# Patient Record
Sex: Female | Born: 1985 | Hispanic: Yes | Marital: Single | Smoking: Former smoker
Health system: Southern US, Community
[De-identification: ages and names within clinical notes are randomized; demographics above are authoritative.]

---

## 2010-05-09 ENCOUNTER — Inpatient Hospital Stay (HOSPITAL_COMMUNITY): Admission: RE | Admit: 2010-05-09 | Discharge: 2010-05-12 | Payer: Self-pay | Admitting: Obstetrics & Gynecology

## 2010-05-09 ENCOUNTER — Ambulatory Visit: Payer: Self-pay | Admitting: Obstetrics & Gynecology

## 2010-05-19 ENCOUNTER — Ambulatory Visit (HOSPITAL_COMMUNITY): Admission: RE | Admit: 2010-05-19 | Discharge: 2010-05-19 | Payer: Self-pay | Admitting: Obstetrics & Gynecology

## 2010-05-22 ENCOUNTER — Ambulatory Visit: Payer: Self-pay | Admitting: Obstetrics & Gynecology

## 2010-05-29 ENCOUNTER — Ambulatory Visit: Payer: Self-pay | Admitting: Family Medicine

## 2010-06-02 ENCOUNTER — Ambulatory Visit (HOSPITAL_COMMUNITY): Admission: RE | Admit: 2010-06-02 | Discharge: 2010-06-02 | Payer: Self-pay | Admitting: Obstetrics & Gynecology

## 2010-06-05 ENCOUNTER — Ambulatory Visit: Payer: Self-pay | Admitting: Obstetrics & Gynecology

## 2010-06-05 ENCOUNTER — Encounter (INDEPENDENT_AMBULATORY_CARE_PROVIDER_SITE_OTHER): Payer: Self-pay | Admitting: *Deleted

## 2010-06-12 ENCOUNTER — Ambulatory Visit: Payer: Self-pay | Admitting: Obstetrics & Gynecology

## 2010-06-17 ENCOUNTER — Encounter: Payer: Self-pay | Admitting: Obstetrics & Gynecology

## 2010-06-17 ENCOUNTER — Ambulatory Visit: Payer: Self-pay | Admitting: Obstetrics and Gynecology

## 2010-06-17 ENCOUNTER — Inpatient Hospital Stay (HOSPITAL_COMMUNITY): Admission: AD | Admit: 2010-06-17 | Discharge: 2010-06-17 | Payer: Self-pay | Admitting: Obstetrics and Gynecology

## 2010-06-18 ENCOUNTER — Inpatient Hospital Stay (HOSPITAL_COMMUNITY): Admission: AD | Admit: 2010-06-18 | Discharge: 2010-06-18 | Payer: Self-pay | Admitting: Obstetrics and Gynecology

## 2010-06-19 ENCOUNTER — Ambulatory Visit: Payer: Self-pay | Admitting: Obstetrics & Gynecology

## 2010-07-03 ENCOUNTER — Encounter (INDEPENDENT_AMBULATORY_CARE_PROVIDER_SITE_OTHER): Payer: Self-pay | Admitting: *Deleted

## 2010-07-03 ENCOUNTER — Ambulatory Visit: Payer: Self-pay | Admitting: Obstetrics and Gynecology

## 2010-07-03 LAB — CONVERTED CEMR LAB
HCT: 36.5 % (ref 36.0–46.0)
Hemoglobin: 12.5 g/dL (ref 12.0–15.0)
MCHC: 34.2 g/dL (ref 30.0–36.0)
MCV: 98.4 fL (ref 78.0–100.0)
Platelets: 235 10*3/uL (ref 150–400)
RBC: 3.71 M/uL — ABNORMAL LOW (ref 3.87–5.11)
RDW: 13 % (ref 11.5–15.5)
Trich, Wet Prep: NONE SEEN
WBC: 8.3 10*3/uL (ref 4.0–10.5)
Yeast Wet Prep HPF POC: NONE SEEN

## 2010-07-15 ENCOUNTER — Ambulatory Visit (HOSPITAL_COMMUNITY)
Admission: RE | Admit: 2010-07-15 | Discharge: 2010-07-15 | Payer: Self-pay | Source: Home / Self Care | Admitting: Obstetrics & Gynecology

## 2010-07-17 ENCOUNTER — Ambulatory Visit: Payer: Self-pay | Admitting: Family Medicine

## 2010-07-17 ENCOUNTER — Encounter: Payer: Self-pay | Admitting: Physician Assistant

## 2010-07-24 ENCOUNTER — Ambulatory Visit: Payer: Self-pay | Admitting: Obstetrics & Gynecology

## 2010-07-31 ENCOUNTER — Ambulatory Visit: Payer: Self-pay | Admitting: Obstetrics & Gynecology

## 2010-08-07 ENCOUNTER — Ambulatory Visit: Payer: Self-pay | Admitting: Family Medicine

## 2010-08-14 ENCOUNTER — Ambulatory Visit: Payer: Self-pay | Admitting: Obstetrics & Gynecology

## 2010-08-14 ENCOUNTER — Encounter: Payer: Self-pay | Admitting: Obstetrics & Gynecology

## 2010-08-15 ENCOUNTER — Encounter: Payer: Self-pay | Admitting: Obstetrics & Gynecology

## 2010-08-15 LAB — CONVERTED CEMR LAB
Chlamydia, DNA Probe: NEGATIVE
GC Probe Amp, Genital: NEGATIVE

## 2010-08-21 ENCOUNTER — Ambulatory Visit
Admission: RE | Admit: 2010-08-21 | Discharge: 2010-08-21 | Payer: Self-pay | Source: Home / Self Care | Attending: Obstetrics and Gynecology | Admitting: Obstetrics and Gynecology

## 2010-08-21 LAB — POCT URINALYSIS DIPSTICK
Hemoglobin, Urine: NEGATIVE
Ketones, ur: NEGATIVE mg/dL
Nitrite: POSITIVE — AB
Protein, ur: 30 mg/dL — AB
Specific Gravity, Urine: >=1.03 (ref 1.005–1.030)
Urine Glucose, Fasting: NEGATIVE mg/dL
Urobilinogen, UA: 0.2 mg/dL (ref 0.0–1.0)
pH: 6.5 (ref 5.0–8.0)

## 2010-08-22 ENCOUNTER — Encounter (INDEPENDENT_AMBULATORY_CARE_PROVIDER_SITE_OTHER): Payer: Self-pay | Admitting: *Deleted

## 2010-08-23 ENCOUNTER — Inpatient Hospital Stay (HOSPITAL_COMMUNITY)
Admission: AD | Admit: 2010-08-23 | Discharge: 2010-08-25 | Payer: Self-pay | Source: Home / Self Care | Attending: Obstetrics & Gynecology | Admitting: Obstetrics & Gynecology

## 2010-08-28 ENCOUNTER — Ambulatory Visit: Admit: 2010-08-28 | Payer: Self-pay | Admitting: Obstetrics and Gynecology

## 2010-09-01 LAB — CBC
HCT: 37.1 % (ref 36.0–46.0)
Hemoglobin: 13.4 g/dL (ref 12.0–15.0)
MCH: 34.4 pg — ABNORMAL HIGH (ref 26.0–34.0)
MCHC: 36.1 g/dL — ABNORMAL HIGH (ref 30.0–36.0)
MCV: 95.1 fL (ref 78.0–100.0)
Platelets: 163 10*3/uL (ref 150–400)
RBC: 3.9 MIL/uL (ref 3.87–5.11)
RDW: 12.6 % (ref 11.5–15.5)
WBC: 10.1 10*3/uL (ref 4.0–10.5)

## 2010-09-01 LAB — RPR: RPR Ser Ql: NONREACTIVE

## 2010-09-01 LAB — ABO/RH: ABO/RH(D): O POS

## 2010-10-27 LAB — POCT URINALYSIS DIPSTICK
Bilirubin Urine: NEGATIVE
Bilirubin Urine: NEGATIVE
Bilirubin Urine: NEGATIVE
Glucose, UA: NEGATIVE mg/dL
Glucose, UA: NEGATIVE mg/dL
Glucose, UA: NEGATIVE mg/dL
Hgb urine dipstick: NEGATIVE
Hgb urine dipstick: NEGATIVE
Ketones, ur: NEGATIVE mg/dL
Ketones, ur: NEGATIVE mg/dL
Ketones, ur: NEGATIVE mg/dL
Nitrite: NEGATIVE
Nitrite: NEGATIVE
Nitrite: NEGATIVE
Protein, ur: NEGATIVE mg/dL
Protein, ur: NEGATIVE mg/dL
Protein, ur: NEGATIVE mg/dL
Specific Gravity, Urine: 1.02 (ref 1.005–1.030)
Specific Gravity, Urine: 1.02 (ref 1.005–1.030)
Specific Gravity, Urine: 1.025 (ref 1.005–1.030)
Urobilinogen, UA: 0.2 mg/dL (ref 0.0–1.0)
Urobilinogen, UA: 0.2 mg/dL (ref 0.0–1.0)
Urobilinogen, UA: 0.2 mg/dL (ref 0.0–1.0)
pH: 6.5 (ref 5.0–8.0)
pH: 7 (ref 5.0–8.0)
pH: 7 (ref 5.0–8.0)

## 2010-10-28 LAB — POCT URINALYSIS DIPSTICK
Bilirubin Urine: NEGATIVE
Bilirubin Urine: NEGATIVE
Bilirubin Urine: NEGATIVE
Bilirubin Urine: NEGATIVE
Glucose, UA: NEGATIVE mg/dL
Glucose, UA: NEGATIVE mg/dL
Glucose, UA: NEGATIVE mg/dL
Glucose, UA: NEGATIVE mg/dL
Hgb urine dipstick: NEGATIVE
Hgb urine dipstick: NEGATIVE
Hgb urine dipstick: NEGATIVE
Hgb urine dipstick: NEGATIVE
Ketones, ur: NEGATIVE mg/dL
Ketones, ur: NEGATIVE mg/dL
Ketones, ur: NEGATIVE mg/dL
Ketones, ur: NEGATIVE mg/dL
Nitrite: NEGATIVE
Nitrite: NEGATIVE
Nitrite: NEGATIVE
Nitrite: NEGATIVE
Protein, ur: 30 mg/dL — AB
Protein, ur: NEGATIVE mg/dL
Protein, ur: NEGATIVE mg/dL
Protein, ur: NEGATIVE mg/dL
Specific Gravity, Urine: 1.02 (ref 1.005–1.030)
Specific Gravity, Urine: 1.025 (ref 1.005–1.030)
Specific Gravity, Urine: 1.02 (ref 1.005–1.030)
Specific Gravity, Urine: 1.025 (ref 1.005–1.030)
Urobilinogen, UA: 0.2 mg/dL (ref 0.0–1.0)
Urobilinogen, UA: 0.2 mg/dL (ref 0.0–1.0)
Urobilinogen, UA: 0.2 mg/dL (ref 0.0–1.0)
Urobilinogen, UA: 0.2 mg/dL (ref 0.0–1.0)
pH: 6.5 (ref 5.0–8.0)
pH: 6.5 (ref 5.0–8.0)
pH: 6 (ref 5.0–8.0)
pH: 7 (ref 5.0–8.0)

## 2010-10-29 LAB — POCT URINALYSIS DIPSTICK
Bilirubin Urine: NEGATIVE
Bilirubin Urine: NEGATIVE
Glucose, UA: NEGATIVE mg/dL
Glucose, UA: NEGATIVE mg/dL
Hgb urine dipstick: NEGATIVE
Hgb urine dipstick: NEGATIVE
Ketones, ur: NEGATIVE mg/dL
Ketones, ur: NEGATIVE mg/dL
Nitrite: NEGATIVE
Nitrite: NEGATIVE
Protein, ur: NEGATIVE mg/dL
Protein, ur: NEGATIVE mg/dL
Specific Gravity, Urine: 1.015 (ref 1.005–1.030)
Specific Gravity, Urine: 1.02 (ref 1.005–1.030)
Urobilinogen, UA: 0.2 mg/dL (ref 0.0–1.0)
Urobilinogen, UA: 0.2 mg/dL (ref 0.0–1.0)
pH: 7 (ref 5.0–8.0)
pH: 7 (ref 5.0–8.0)

## 2010-10-30 LAB — URINALYSIS, ROUTINE W REFLEX MICROSCOPIC
Bilirubin Urine: NEGATIVE
Glucose, UA: NEGATIVE mg/dL
Hgb urine dipstick: NEGATIVE
Ketones, ur: NEGATIVE mg/dL
Nitrite: NEGATIVE
Protein, ur: NEGATIVE mg/dL
Specific Gravity, Urine: 1.01 (ref 1.005–1.030)
Urobilinogen, UA: 0.2 mg/dL (ref 0.0–1.0)
pH: 7 (ref 5.0–8.0)

## 2010-10-30 LAB — POCT URINALYSIS DIPSTICK
Bilirubin Urine: NEGATIVE
Bilirubin Urine: NEGATIVE
Glucose, UA: NEGATIVE mg/dL
Glucose, UA: NEGATIVE mg/dL
Hgb urine dipstick: NEGATIVE
Hgb urine dipstick: NEGATIVE
Ketones, ur: NEGATIVE mg/dL
Ketones, ur: NEGATIVE mg/dL
Nitrite: NEGATIVE
Nitrite: NEGATIVE
Protein, ur: NEGATIVE mg/dL
Protein, ur: NEGATIVE mg/dL
Specific Gravity, Urine: 1.015 (ref 1.005–1.030)
Specific Gravity, Urine: 1.02 (ref 1.005–1.030)
Urobilinogen, UA: 0.2 mg/dL (ref 0.0–1.0)
Urobilinogen, UA: 0.2 mg/dL (ref 0.0–1.0)
pH: 7 (ref 5.0–8.0)
pH: 7 (ref 5.0–8.0)

## 2010-10-30 LAB — GC/CHLAMYDIA PROBE AMP, GENITAL
Chlamydia, DNA Probe: POSITIVE — AB
GC Probe Amp, Genital: NEGATIVE

## 2010-10-30 LAB — HIV ANTIBODY (ROUTINE TESTING W REFLEX): HIV: NONREACTIVE

## 2010-10-30 LAB — CBC
MCV: 103.2 fL — ABNORMAL HIGH (ref 78.0–100.0)
Platelets: 206 10*3/uL (ref 150–400)
RDW: 12.7 % (ref 11.5–15.5)
WBC: 8.3 10*3/uL (ref 4.0–10.5)

## 2010-10-30 LAB — WET PREP, GENITAL
Clue Cells Wet Prep HPF POC: NONE SEEN
Trich, Wet Prep: NONE SEEN
Yeast Wet Prep HPF POC: NONE SEEN

## 2010-10-30 LAB — RPR: RPR Ser Ql: NONREACTIVE

## 2010-10-30 LAB — URINE MICROSCOPIC-ADD ON

## 2010-10-30 LAB — STREP B DNA PROBE: Strep Group B Ag: NEGATIVE

## 2010-12-09 IMAGING — US US OB TRANSVAGINAL
1 series · 14 of 24 positions shown · non-contrast
Comparison: none

OBSTETRICAL ULTRASOUND:
 This ultrasound was performed in The [HOSPITAL], and the AS OB/GYN report will be stored to [REDACTED] PACS.  This report is also available in [HOSPITAL]?s accessANYware.

[Series 1: us ob transvaginal · 14 of 24 slices shown]
[im 1/24]
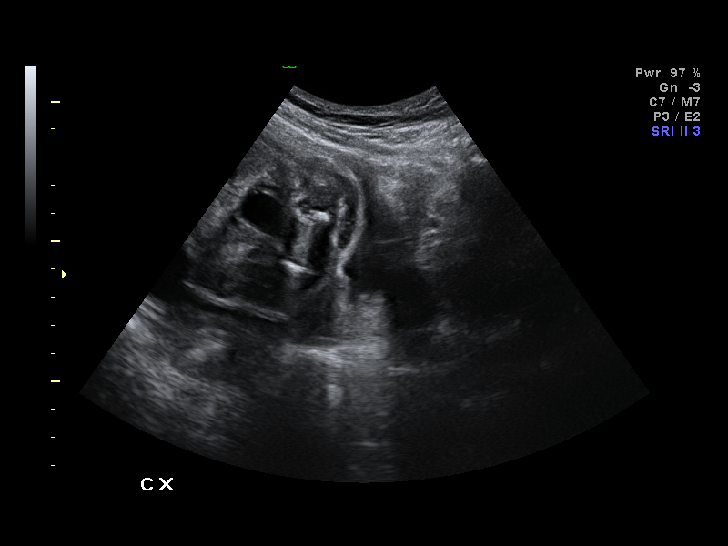
[im 3/24]
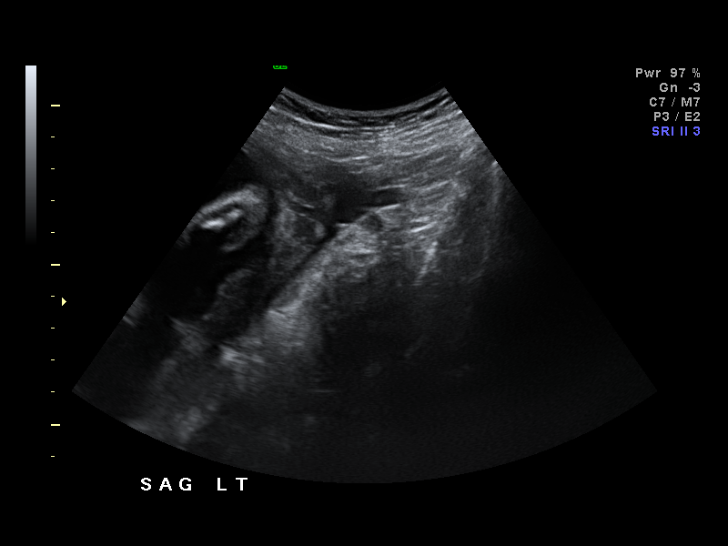
[im 5/24]
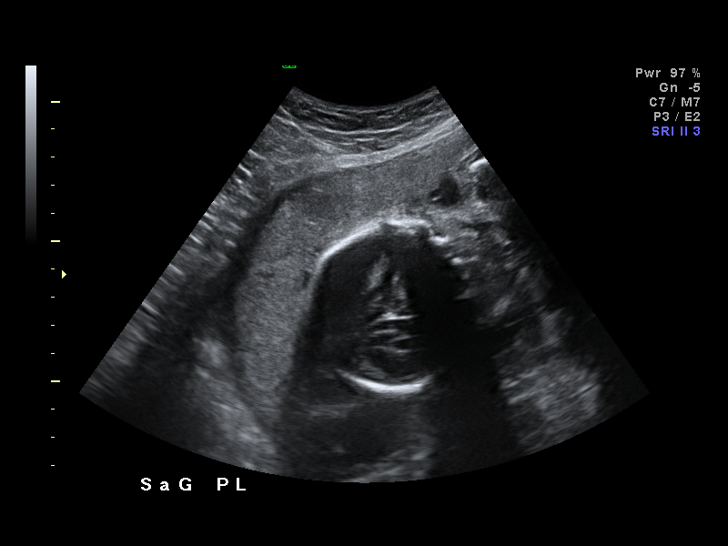
[im 7/24]
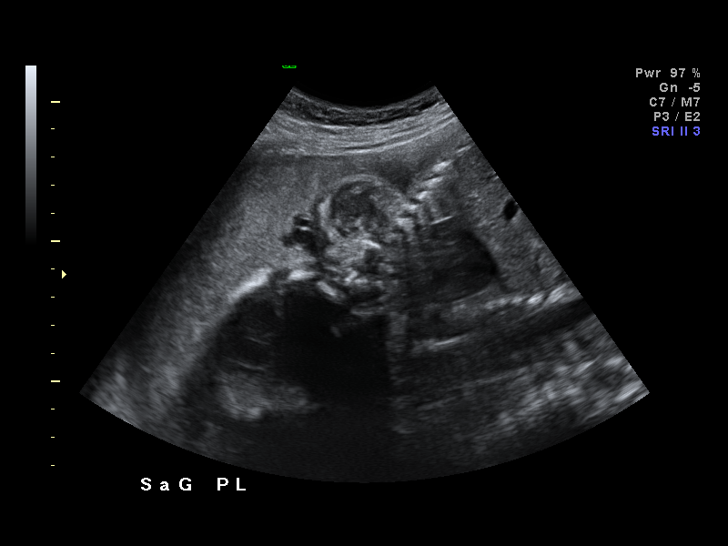
[im 8/24]
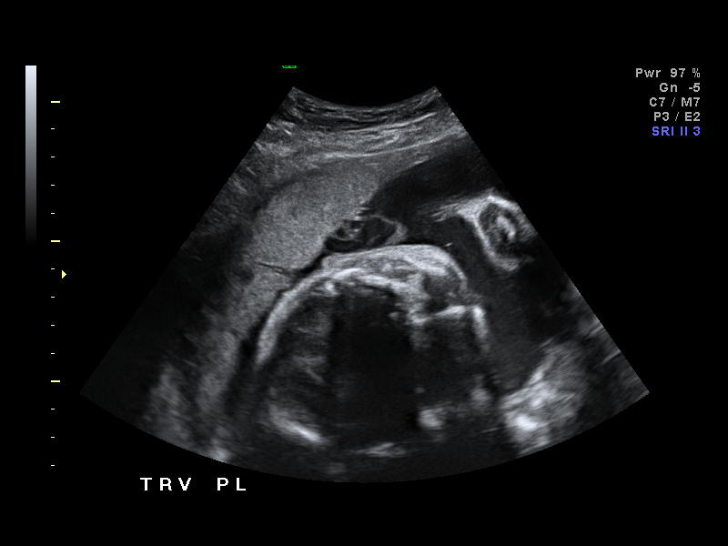
[im 10/24]
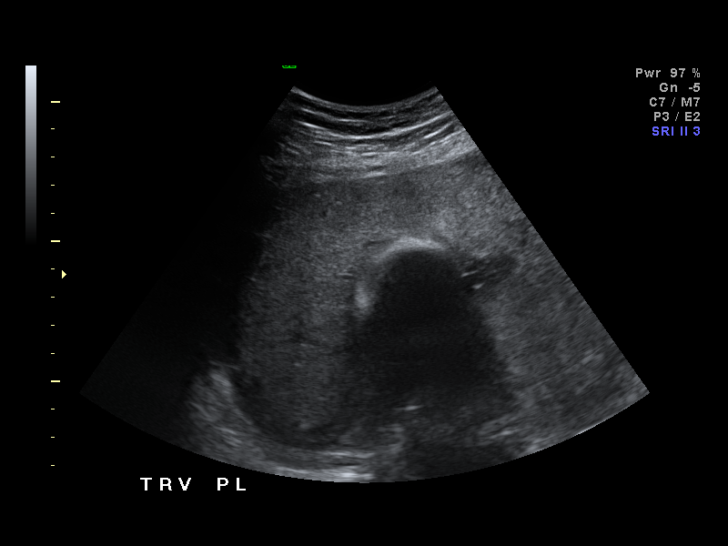
[im 12/24]
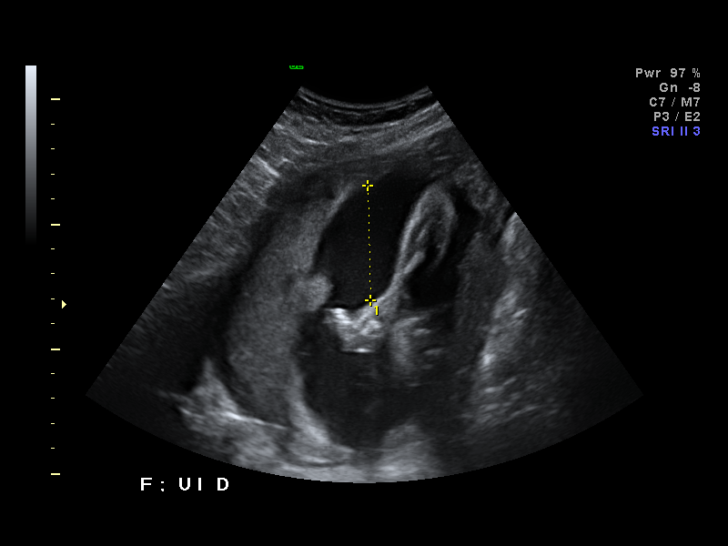
[im 13/24]
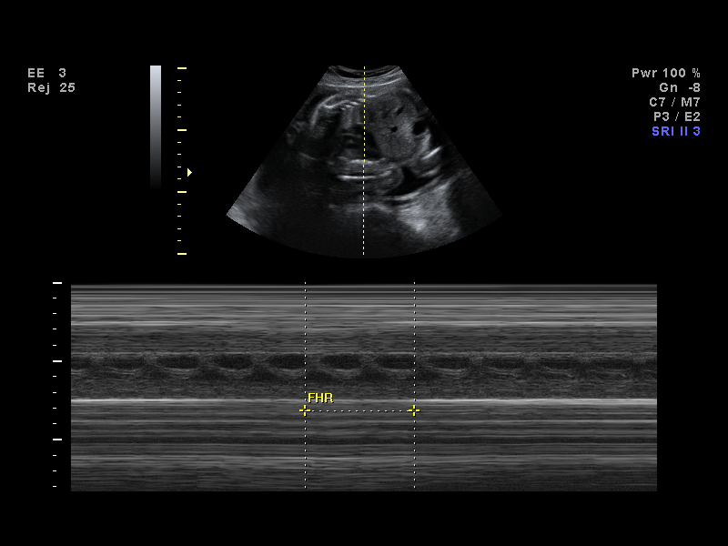
[im 15/24]
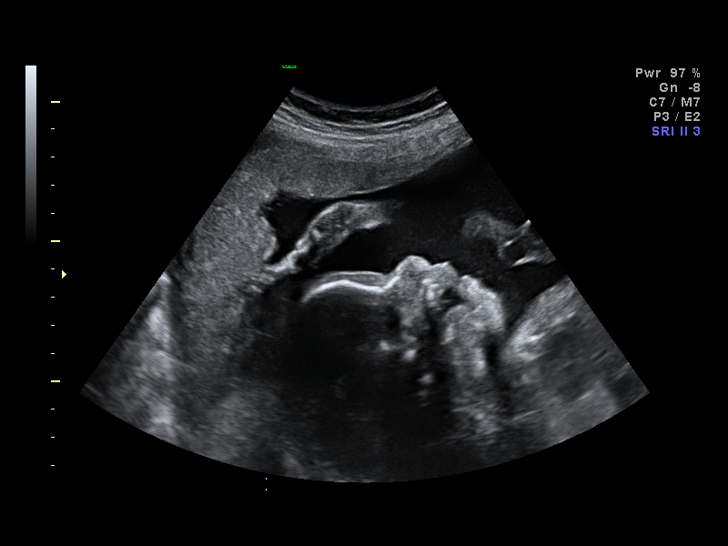
[im 17/24]
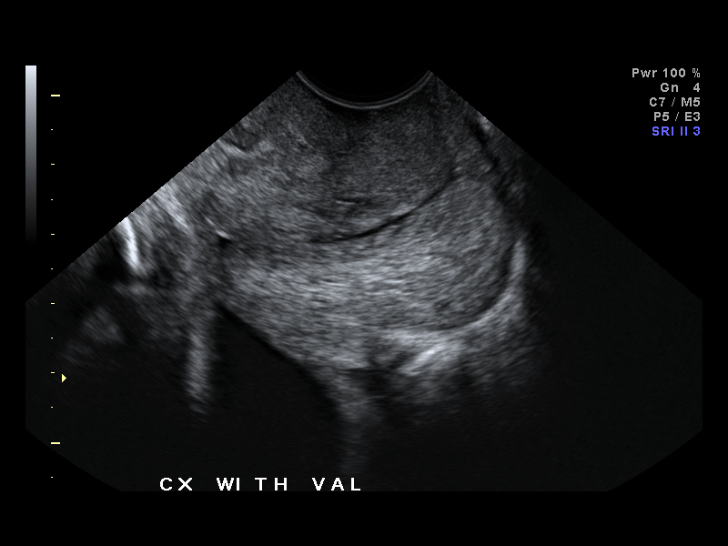
[im 19/24]
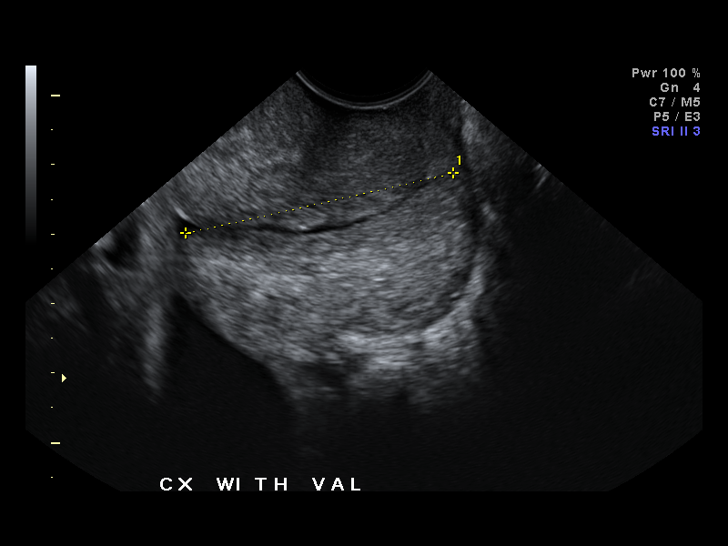
[im 20/24]
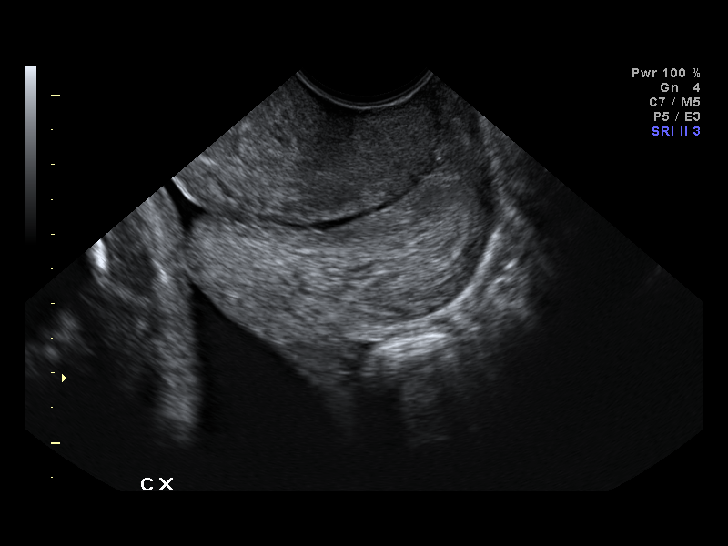
[im 22/24]
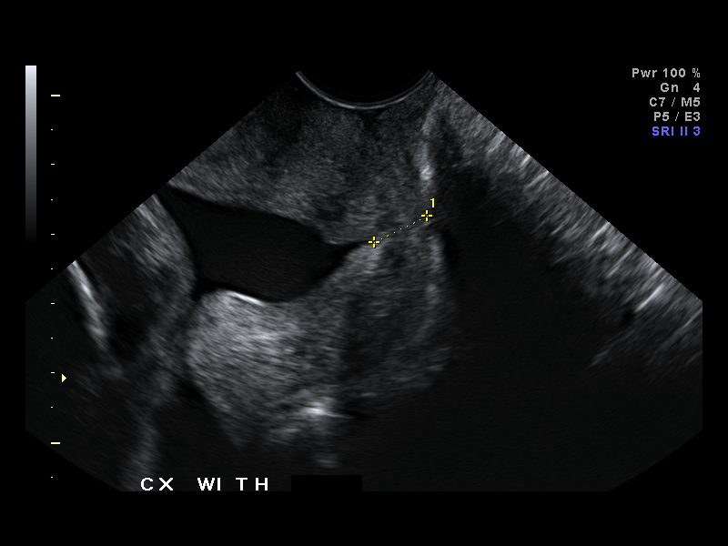
[im 24/24]
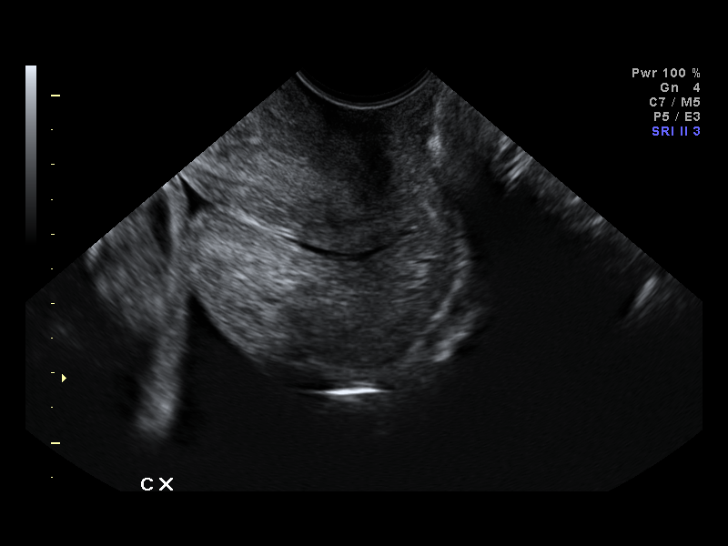

[14 of 24 positions shown; findings below may reference images not displayed]

IMPRESSION: AS OB/GYN has also been faxed to the ordering physician.

## 2010-12-24 IMAGING — US US OB TRANSVAGINAL
1 series · 18 of 28 positions shown · non-contrast
Comparison: none

OBSTETRICAL ULTRASOUND:
 This ultrasound was performed in The [HOSPITAL], and the AS OB/GYN report will be stored to [REDACTED] PACS.  This report is also available in [HOSPITAL]?s accessANYware.

[Series 1: us ob transvaginal · 36 acquisitions, 18 frames shown]
[im 1/36]
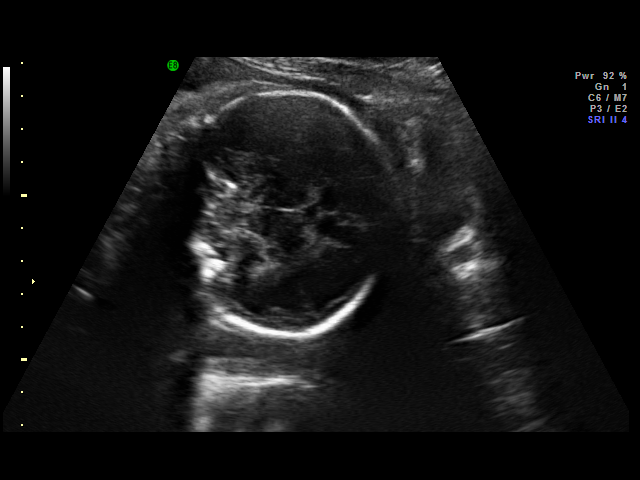
[im 3/36]
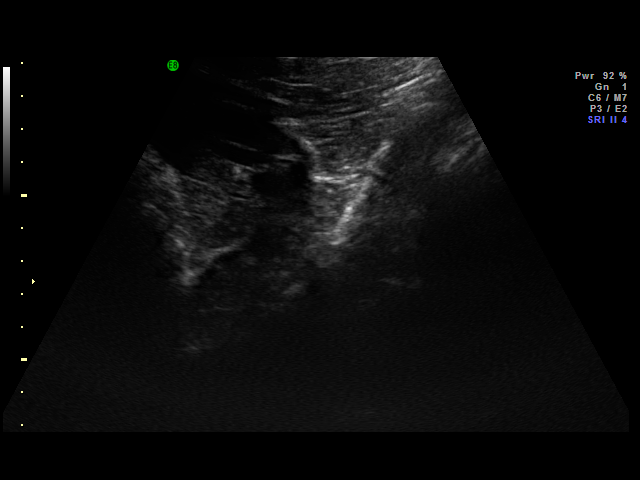
[im 4/36]
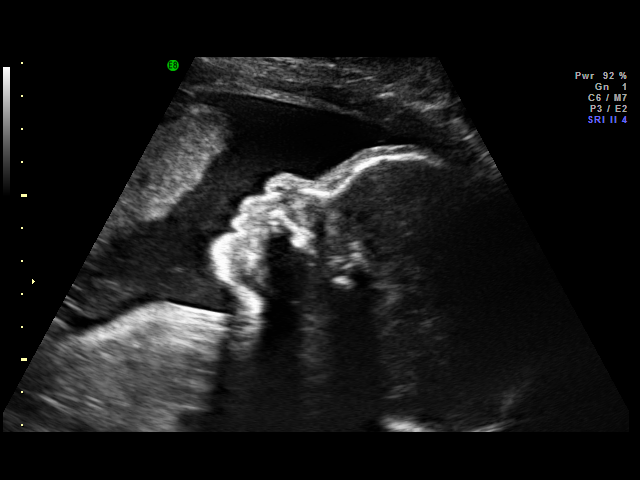
[im 7/36]
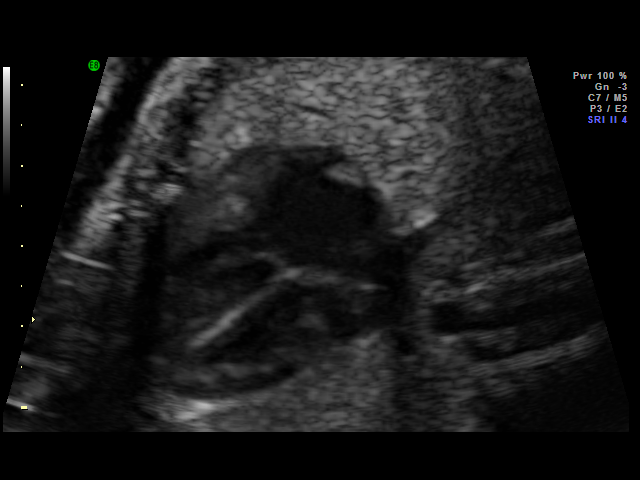
[im 10/36]
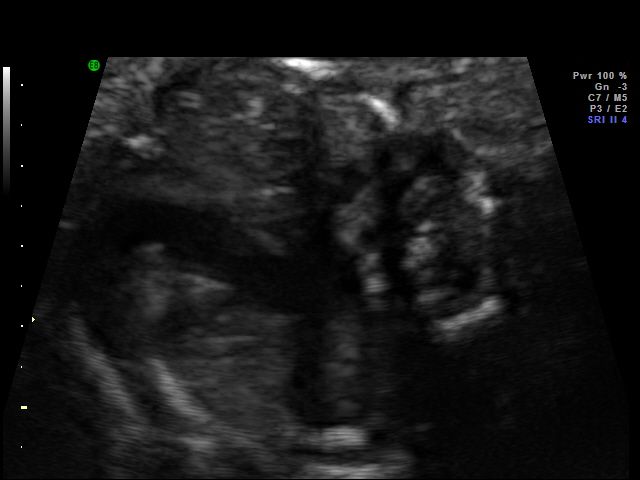
[im 11/36]
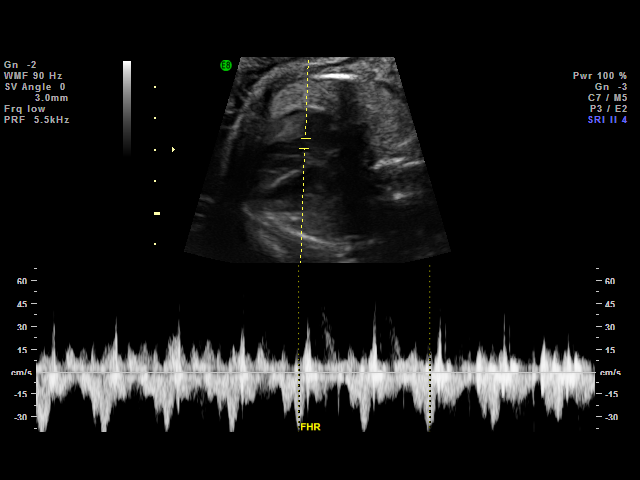
[im 13/36]
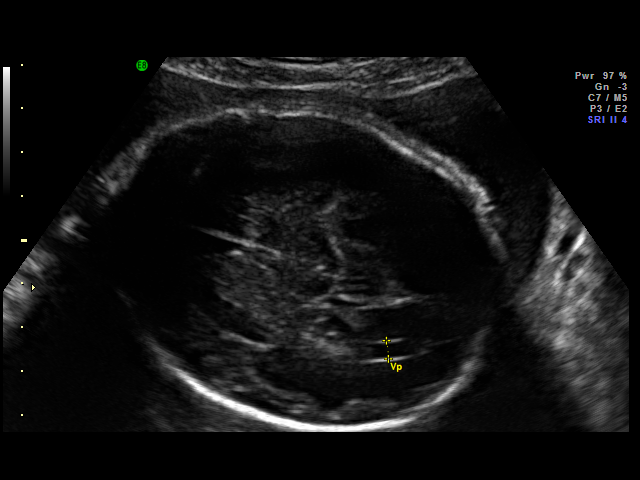
[im 15/36]
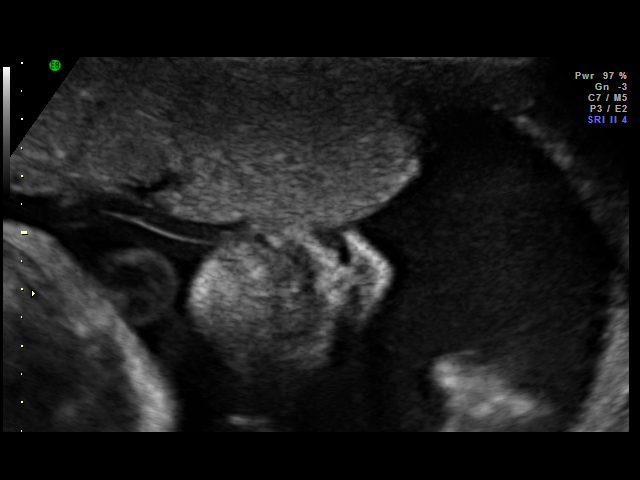
[im 17/36]
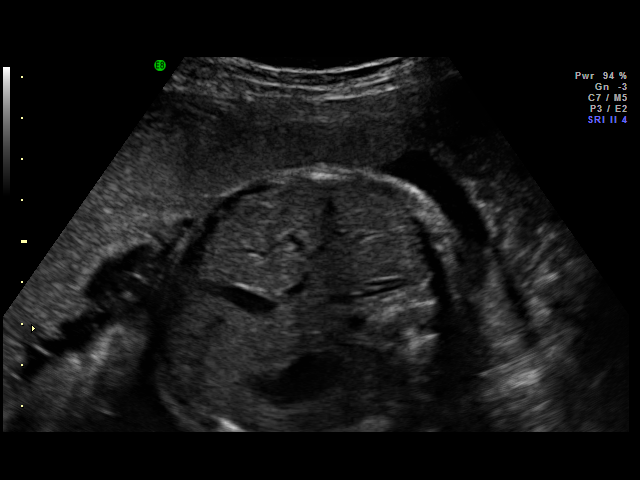
[im 19/36]
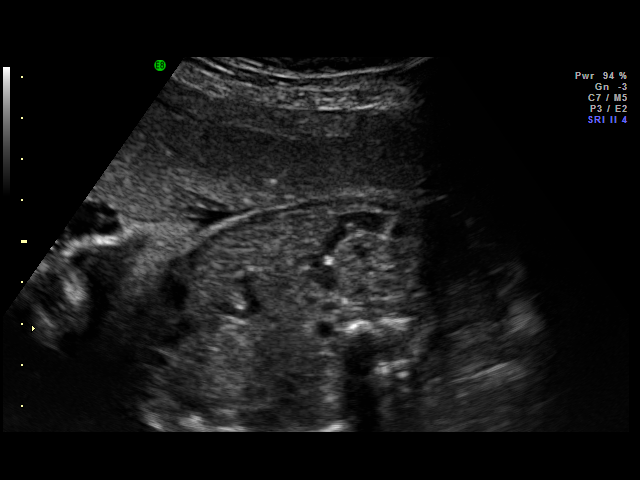
[im 21/36]
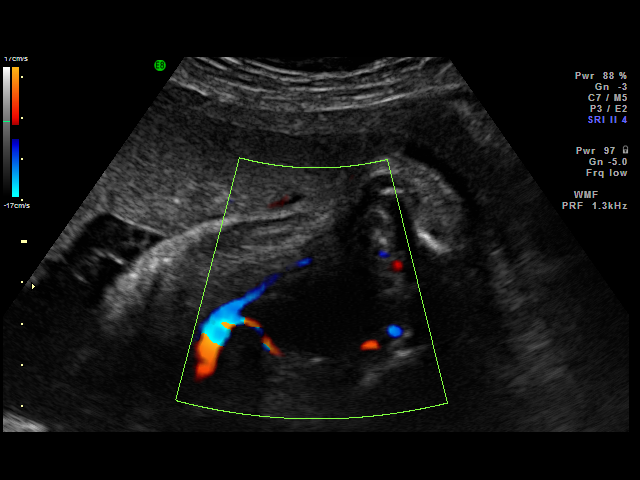
[im 23/36]
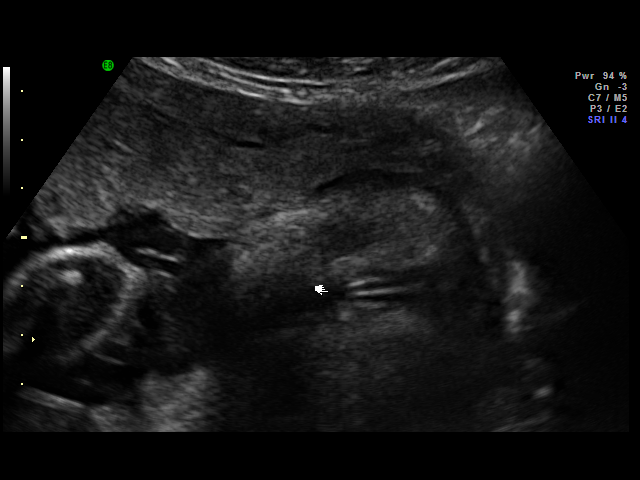
[im 25/36]
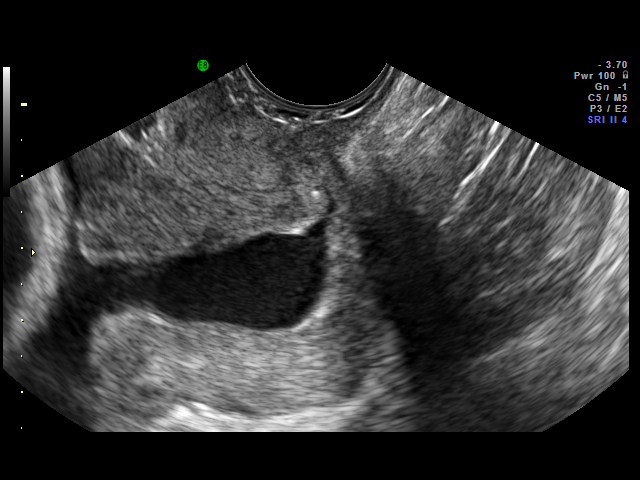
[im 28/36]
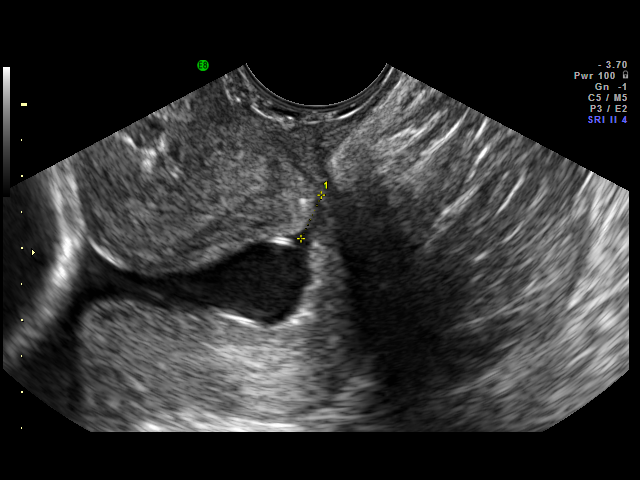
[im 29/36]
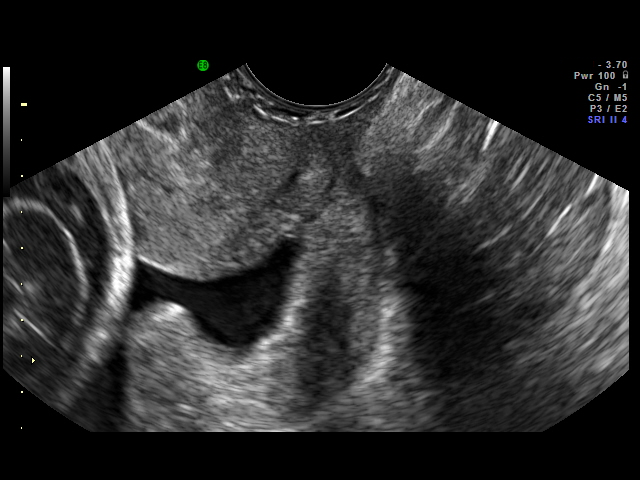
[im 32/36]
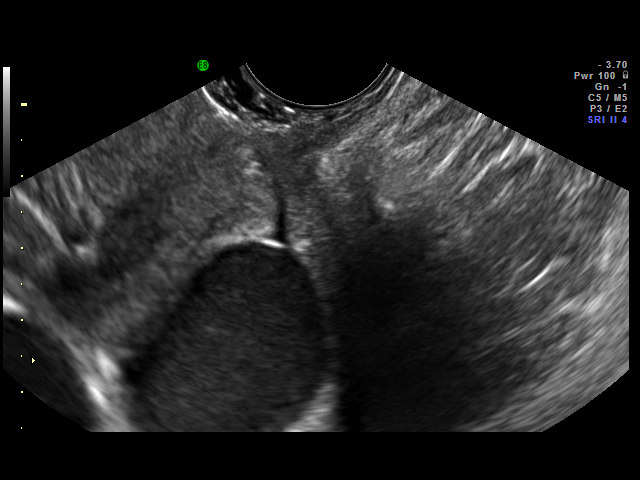
[im 33/36]
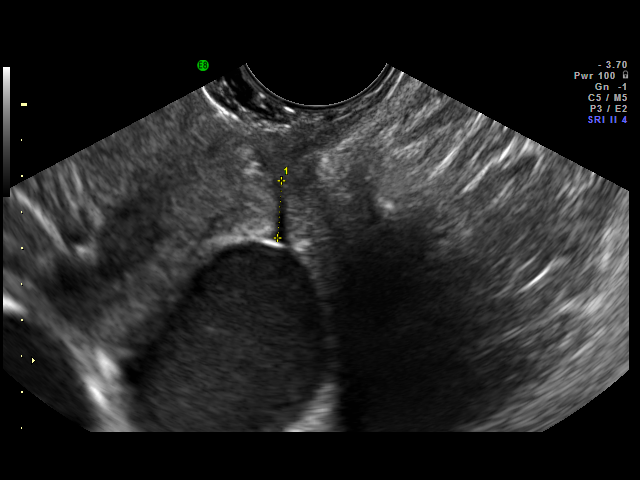
[im 36/36]
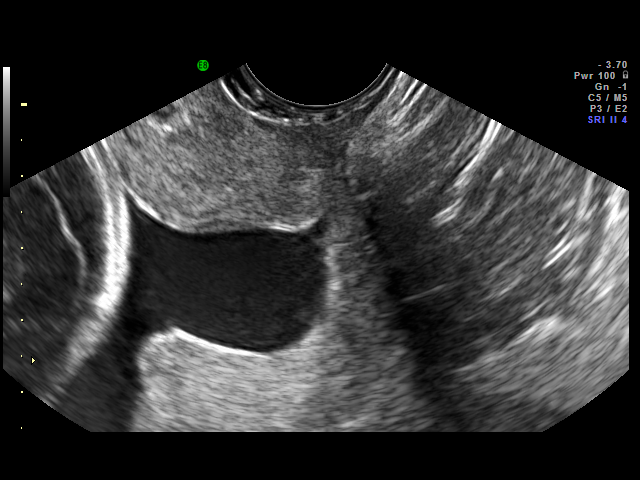

[18 of 28 positions shown; findings below may reference images not displayed]

IMPRESSION: AS OB/GYN has also been faxed to the ordering physician.

## 2013-08-17 NOTE — L&D Delivery Note (Signed)
Delivery Note At 7:26 AM a viable female was delivered via Vaginal, Spontaneous Delivery (Presentation: ; Occiput Anterior).  APGAR: 8, 9; weight pending .   Placenta status: Intact, Spontaneous.  Cord: 3 vessels with the following complications: 1 loose nuchal, reduced prior to delivery.  Cord pH: pending  Anesthesia: None  Episiotomy: None Lacerations: 1st degree Suture Repair: N/A Est. Blood Loss (mL): 250  Mom to postpartum.  Baby to Couplet care / Skin to Skin.  Joanna Puff 05/11/2014, 8:01 AM  I was gloved and present for delivery in its entirety.  Second stage of labor progressed, baby delivered after few contractions.  no decels during second stage noted.  Complications: none  Lacerations: small hemostatic 1st degree  EBL: 250  Bayyinah Dukeman ROCIO, MD 5:42 AM

## 2014-01-11 ENCOUNTER — Other Ambulatory Visit (HOSPITAL_COMMUNITY): Payer: Self-pay | Admitting: Family

## 2014-01-11 DIAGNOSIS — Z3689 Encounter for other specified antenatal screening: Secondary | ICD-10-CM

## 2014-01-11 LAB — OB RESULTS CONSOLE HIV ANTIBODY (ROUTINE TESTING): HIV: NONREACTIVE

## 2014-01-11 LAB — OB RESULTS CONSOLE RPR: RPR: NONREACTIVE

## 2014-01-11 LAB — OB RESULTS CONSOLE ANTIBODY SCREEN: Antibody Screen: NEGATIVE

## 2014-01-11 LAB — OB RESULTS CONSOLE ABO/RH: RH TYPE: POSITIVE

## 2014-01-16 ENCOUNTER — Ambulatory Visit (HOSPITAL_COMMUNITY)
Admission: RE | Admit: 2014-01-16 | Discharge: 2014-01-16 | Disposition: A | Discharge status: Home / Self Care | Payer: Medicaid Other | Source: Ambulatory Visit | Attending: Family | Admitting: Family

## 2014-01-16 DIAGNOSIS — Z3689 Encounter for other specified antenatal screening: Secondary | ICD-10-CM | POA: Insufficient documentation

## 2014-04-19 LAB — OB RESULTS CONSOLE GC/CHLAMYDIA
Chlamydia: NEGATIVE
GC PROBE AMP, GENITAL: NEGATIVE

## 2014-04-19 LAB — OB RESULTS CONSOLE HIV ANTIBODY (ROUTINE TESTING): HIV: NONREACTIVE

## 2014-04-19 LAB — OB RESULTS CONSOLE GBS: STREP GROUP B AG: NEGATIVE

## 2014-05-11 ENCOUNTER — Inpatient Hospital Stay (HOSPITAL_COMMUNITY)
Admission: AD | Admit: 2014-05-11 | Discharge: 2014-05-12 | DRG: 775 | Disposition: A | Discharge status: Home / Self Care | Payer: Medicaid Other | Source: Ambulatory Visit | Attending: Family Medicine | Admitting: Family Medicine

## 2014-05-11 ENCOUNTER — Encounter (HOSPITAL_COMMUNITY): Payer: Self-pay | Admitting: Medical

## 2014-05-11 DIAGNOSIS — Z87891 Personal history of nicotine dependence: Secondary | ICD-10-CM

## 2014-05-11 DIAGNOSIS — Z3490 Encounter for supervision of normal pregnancy, unspecified, unspecified trimester: Secondary | ICD-10-CM

## 2014-05-11 DIAGNOSIS — O479 False labor, unspecified: Secondary | ICD-10-CM | POA: Diagnosis present

## 2014-05-11 LAB — CBC
HCT: 35.6 % — ABNORMAL LOW (ref 36.0–46.0)
HEMOGLOBIN: 12.9 g/dL (ref 12.0–15.0)
MCH: 34.8 pg — ABNORMAL HIGH (ref 26.0–34.0)
MCHC: 36.2 g/dL — ABNORMAL HIGH (ref 30.0–36.0)
MCV: 96 fL (ref 78.0–100.0)
PLATELETS: 170 10*3/uL (ref 150–400)
RBC: 3.71 MIL/uL — AB (ref 3.87–5.11)
RDW: 12.5 % (ref 11.5–15.5)
WBC: 9.9 10*3/uL (ref 4.0–10.5)

## 2014-05-11 LAB — TYPE AND SCREEN
ABO/RH(D): O POS
Antibody Screen: NEGATIVE

## 2014-05-11 LAB — RPR

## 2014-05-11 MED ORDER — ONDANSETRON HCL 4 MG/2ML IJ SOLN: 4.0000 mg | Freq: Four times a day (QID) | INTRAMUSCULAR | Status: DC | PRN

## 2014-05-11 MED ORDER — ZOLPIDEM TARTRATE 5 MG PO TABS: 5.0000 mg | ORAL_TABLET | Freq: Every evening | ORAL | Status: DC | PRN

## 2014-05-11 MED ORDER — SENNOSIDES-DOCUSATE SODIUM 8.6-50 MG PO TABS
2.0000 | ORAL_TABLET | ORAL | Status: DC
  Administered 2014-05-12: 2 via ORAL
  Filled 2014-05-11: qty 2

## 2014-05-11 MED ORDER — FLEET ENEMA 7-19 GM/118ML RE ENEM
1.0000 | ENEMA | RECTAL | Status: DC | PRN
Start: 2014-05-11 — End: 2014-05-11

## 2014-05-11 MED ORDER — OXYTOCIN BOLUS FROM INFUSION
500.0000 mL | INTRAVENOUS | Status: DC
  Administered 2014-05-11: 500 mL via INTRAVENOUS

## 2014-05-11 MED ORDER — TETANUS-DIPHTH-ACELL PERTUSSIS 5-2.5-18.5 LF-MCG/0.5 IM SUSP: 0.5000 mL | Freq: Once | INTRAMUSCULAR | Status: DC

## 2014-05-11 MED ORDER — DIBUCAINE 1 % RE OINT: 1.0000 "application " | TOPICAL_OINTMENT | RECTAL | Status: DC | PRN

## 2014-05-11 MED ORDER — LACTATED RINGERS IV SOLN: INTRAVENOUS | Status: DC

## 2014-05-11 MED ORDER — LANOLIN HYDROUS EX OINT: TOPICAL_OINTMENT | CUTANEOUS | Status: DC | PRN

## 2014-05-11 MED ORDER — WITCH HAZEL-GLYCERIN EX PADS: 1.0000 "application " | MEDICATED_PAD | CUTANEOUS | Status: DC | PRN

## 2014-05-11 MED ORDER — IBUPROFEN 600 MG PO TABS
600.0000 mg | ORAL_TABLET | Freq: Four times a day (QID) | ORAL | Status: DC
  Administered 2014-05-11 – 2014-05-12 (×5): 600 mg via ORAL
  Filled 2014-05-11 (×5): qty 1

## 2014-05-11 MED ORDER — ONDANSETRON HCL 4 MG/2ML IJ SOLN: 4.0000 mg | INTRAMUSCULAR | Status: DC | PRN

## 2014-05-11 MED ORDER — OXYCODONE-ACETAMINOPHEN 5-325 MG PO TABS: 1.0000 | ORAL_TABLET | ORAL | Status: DC | PRN

## 2014-05-11 MED ORDER — ACETAMINOPHEN 325 MG PO TABS: 650.0000 mg | ORAL_TABLET | ORAL | Status: DC | PRN

## 2014-05-11 MED ORDER — OXYTOCIN 40 UNITS IN LACTATED RINGERS INFUSION - SIMPLE MED
62.5000 mL/h | INTRAVENOUS | Status: DC
  Filled 2014-05-11: qty 1000

## 2014-05-11 MED ORDER — ONDANSETRON HCL 4 MG PO TABS: 4.0000 mg | ORAL_TABLET | ORAL | Status: DC | PRN

## 2014-05-11 MED ORDER — LACTATED RINGERS IV SOLN: 500.0000 mL | INTRAVENOUS | Status: DC | PRN

## 2014-05-11 MED ORDER — CITRIC ACID-SODIUM CITRATE 334-500 MG/5ML PO SOLN
30.0000 mL | ORAL | Status: DC | PRN
Start: 2014-05-11 — End: 2014-05-11

## 2014-05-11 MED ORDER — PRENATAL MULTIVITAMIN CH
1.0000 | ORAL_TABLET | Freq: Every day | ORAL | Status: DC
  Administered 2014-05-11 – 2014-05-12 (×2): 1 via ORAL
  Filled 2014-05-11 (×2): qty 1

## 2014-05-11 MED ORDER — OXYCODONE-ACETAMINOPHEN 5-325 MG PO TABS: 2.0000 | ORAL_TABLET | ORAL | Status: DC | PRN

## 2014-05-11 MED ORDER — LIDOCAINE HCL (PF) 1 % IJ SOLN
30.0000 mL | INTRAMUSCULAR | Status: DC | PRN
  Filled 2014-05-11: qty 30

## 2014-05-11 MED ORDER — DIPHENHYDRAMINE HCL 25 MG PO CAPS: 25.0000 mg | ORAL_CAPSULE | Freq: Four times a day (QID) | ORAL | Status: DC | PRN

## 2014-05-11 MED ORDER — BENZOCAINE-MENTHOL 20-0.5 % EX AERO
1.0000 "application " | INHALATION_SPRAY | CUTANEOUS | Status: DC | PRN
  Administered 2014-05-11: 1 via TOPICAL
  Filled 2014-05-11: qty 56

## 2014-05-11 MED ORDER — SIMETHICONE 80 MG PO CHEW: 80.0000 mg | CHEWABLE_TABLET | ORAL | Status: DC | PRN

## 2014-05-11 NOTE — MAU Note (Signed)
PT  SAYS SHE HURT BAD AT  0200.   WENT TO HD  TODAY-  VE -  3-4 CM  . DENIES HSV AND MRSA.  GBS-  NEG

## 2014-05-11 NOTE — H&P (Signed)
Brooke [name] is a 28 y.o. female G2P1001 with IUP at [redacted]w[redacted]d presenting for contractions. No LOF or vaginal bleeding. Good fetal movement  [facility]/16/14 by LMP c/w 23.1wk U/S   Prenatal History/Complications: Late to prenatal care  Past Medical History: None  Past Surgical History: History reviewed. No pertinent past surgical history.  Obstetrical History: OB History   Grav Para Term Preterm Abortions TAB SAB Ect Mult Living   Social History: History   Social History  . Marital Status: Single    Spouse Name: N/A    Number of Children: N/A  . Years of Education: N/A   Social History Main Topics  . Smoking status: Former Games developer  . Smokeless tobacco: None  . Alcohol Use: No  . Drug Use: No  . Sexual Activity: None   Other Topics Concern  . None   Social History Narrative  . None    Family History: History reviewed. No pertinent family history.  Allergies: No Known Allergies  Prescriptions prior to admission  Medication Sig Dispense Refill  . beta carotene w/minerals (OCUVITE) tablet Take 1 tablet by mouth daily.         Review of Systems   Constitutional: No fever, chills, fatigue, headache  Blood pressure 117/67, pulse 68, temperature 98.4 F (36.9 C), temperature source Oral, resp. rate 20, height 5' (1.524 m), weight 66.735 kg (147 lb 2 oz), last menstrual period 08/09/2013. General appearance: alert, cooperative and no distress Lungs: clear to auscultation bilaterally Heart: regular rate and rhythm Abdomen: soft, non-tender; bowel sounds normal Pelvic: adequate Extremities: Homans sign is negative, no sign of DVT DTR's 2+ Presentation: cephalic Fetal monitoringBaseline: 125 bpm, Variability: Good {> 6 bpm), Accelerations: Reactive and Decelerations: Absent Uterine activityFrequency: Every 2 minutes Dilation: 6 Effacement (%): 90 Station: -1 Exam by:: DCALLAWAY, RN   Prenatal labs: ABO,  Rh:  O pos Antibody:  Neg Rubella:  immune RPR:   neg HBsAg:   neg HIV:   neg GBS:   neg 1 hr Glucola 95>>86 Genetic screening Declined Anatomy US normal  22.6wk U/S: EFW 1lb 5oz, 61%   Prenatal Transfer Tool  Maternal Diabetes: No Genetic Screening: Normal Maternal Ultrasounds/Referrals: Normal Fetal Ultrasounds or other Referrals:  None Maternal Substance Abuse:  No Significant Maternal Medications:  None Significant Maternal Lab Results: Lab values include: Group B Strep negative  Assessment: Brooke [name] is a 28 y.o. G2P1001 at [redacted]w[redacted]d by LMP c/w 23.1wk U/S here for SOOL. #Labor: Will reassess in 2 hours and determine whether augmentation with pitocin is required #Pain: No medications  #FWB: Cat 1 #ID:  GBS neg  #MOF: Breast/bottle #MOC:Nexplanon  #Circ:  Girl  Joanna Puff 05/11/2014, 3:30 AM   OB fellow attestation:  I have seen and examined this patient; I agree with above documentation in the resident's note.   Brooke [name] is a 28 y.o. G2P2002 reporting SOL +FM, denies LOF, VB, vaginal discharge.  PE: BP 103/53  Pulse 72  Temp(Src) 98.5 F (36.9 C) (Oral)  Resp 18  Ht 5' (1.524 m)  Wt 147 lb (66.679 kg)  BMI 28.71 kg/m2  LMP 08/09/2013  Breastfeeding? Unknown Gen: calm comfortable, NAD Resp: normal effort, no distress Abd: gravid  ROS, labs, PMH reviewed NST reactive  Plan: - admit to L&D, anticipate SVD  Perry Mount, MD 5:45 AM

## 2014-05-11 NOTE — Lactation Note (Signed)
This note was copied from the chart of Brooke Meshelle Rojas-Salazar. Lactation Consultation Note Initial visit at 9 hours of age.  Baby laying STS on mom's chest.  Mom reports baby just finished a feeding on both breasts.  [facility] LC resources given and discussed.  Encouraged to feed with early cues on demand.  Early newborn behavior discussed.  Hand expression demonstrated no colostrum visible at this time.  Mom unsure about how long she plans to continue breastfeeding, but mentions WIC told her breast milk was best.  I encouraged mom to consider offering any amount of breatmilk if she switches explaining it doesn't have to be all or nothing.    Mom to call for assist as needed.    Patient Name: Brooke [name] [id number] Date: 05/11/2014 Reason for consult: Initial assessment   Maternal Data Has patient been taught Hand Expression?: Yes Does the patient have breastfeeding experience prior to this delivery?: Yes  Feeding    LATCH Score/Interventions                Intervention(s): Breastfeeding basics reviewed     Lactation Tools Discussed/Used WIC Program: Yes   Consult Status Consult Status: Follow-up Date: 05/12/14 Follow-up type: In-patient    Jannifer Rodney 05/11/2014, 4:43 PM

## 2014-05-12 LAB — CBC
HEMATOCRIT: 29.3 % — AB (ref 36.0–46.0)
Hemoglobin: 10.3 g/dL — ABNORMAL LOW (ref 12.0–15.0)
MCH: 34.8 pg — AB (ref 26.0–34.0)
MCHC: 35.8 g/dL (ref 30.0–36.0)
MCV: 97 fL (ref 78.0–100.0)
Platelets: 139 10*3/uL — ABNORMAL LOW (ref 150–400)
RBC: 3.02 MIL/uL — ABNORMAL LOW (ref 3.87–5.11)
RDW: 12.7 % (ref 11.5–15.5)
WBC: 10.6 10*3/uL — ABNORMAL HIGH (ref 4.0–10.5)

## 2014-05-12 NOTE — Discharge Summary (Signed)
Obstetric Discharge Summary Reason for Admission: onset of labor Prenatal Procedures: none Intrapartum Procedures: spontaneous vaginal delivery Postpartum Procedures: none Complications-Operative and Postpartum: 1st degree perineal laceration  Delivery Note At 7:26 AM a viable female was delivered via Vaginal, Spontaneous Delivery (Presentation: ; Occiput Anterior).  APGAR: 8, 9; weight 7 lb 7 oz (3374 g).   Placenta status: Intact, Spontaneous.  Cord: 3 vessels with the following complications: None.  Anesthesia: None  Episiotomy: None Lacerations: 1st degree Suture Repair: n/a, hemostatic Est. Blood Loss (mL): 250  Mom to postpartum.  Baby to Couplet care / Skin to Skin.  Brooke [name] 05/12/2014, 6:26 AM     Hospital Course:  Active Problems:   Pregnancy   Today: No acute events overnight.  Pt denies problems with ambulating, voiding or po intake.  She denies nausea or vomiting.  Pain is well controlled.  Lochia Moderate.  Plan for birth control isnexplanon.  Method of Feeding: breast  Brooke [name] is a 28 y.o. [id number] s/p SVD.  Patient presented to OBT with SOL and was admitted to L&D.  She has postpartum course that was uncomplicated including no problems with ambulating, PO intake, urination, pain, or bleeding. The pt feels ready to go home and  will be discharged with outpatient follow-up.    H/H: Lab Results  Component Value Date/Time   HGB 12.9 05/11/2014  4:00 AM   HCT 35.6* 05/11/2014  4:00 AM    Discharge Diagnoses: Term Pregnancy-delivered  Discharge Information: Date: 05/12/2014 Activity: pelvic rest Diet: routine  Medications: None Breast feeding:  Yes Condition: stable Instructions: refer to handout Discharge to: home      Medication List    ASK your doctor about these medications       prenatal multivitamin Tabs tablet  Take 1 tablet by mouth daily at 12 noon.         Perry Mount ,MD OB Fellow 05/12/2014,6:26  AM

## 2014-05-12 NOTE — Discharge Instructions (Signed)

## 2014-05-13 NOTE — Discharge Summary (Signed)
Attestation of Attending Supervision of Advanced Practitioner: Evaluation and management procedures were performed by the PA/NP/CNM/OB Fellow under my supervision/collaboration. Chart reviewed and agree with management and plan.  Brooke [name] V 05/13/2014 4:47 PM

## 2014-05-16 NOTE — Progress Notes (Signed)
Post discharge chart review completed.  

## 2014-06-18 ENCOUNTER — Encounter (HOSPITAL_COMMUNITY): Payer: Self-pay | Admitting: Medical

## 2014-07-25 IMAGING — US US OB COMP +14 WK
1 series · 12 of 28 positions shown · non-contrast
Comparison: none

[Series 1: us ob comp +14 wk · 12 of 97 slices shown]
[im 4/97]
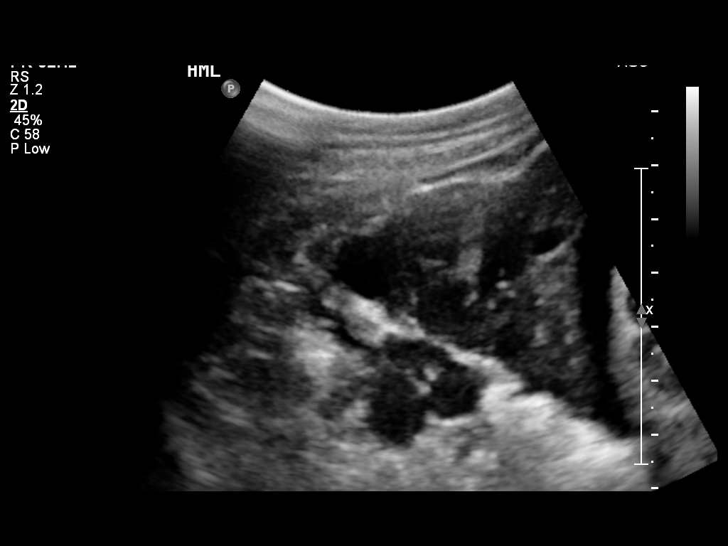
[im 11/97]
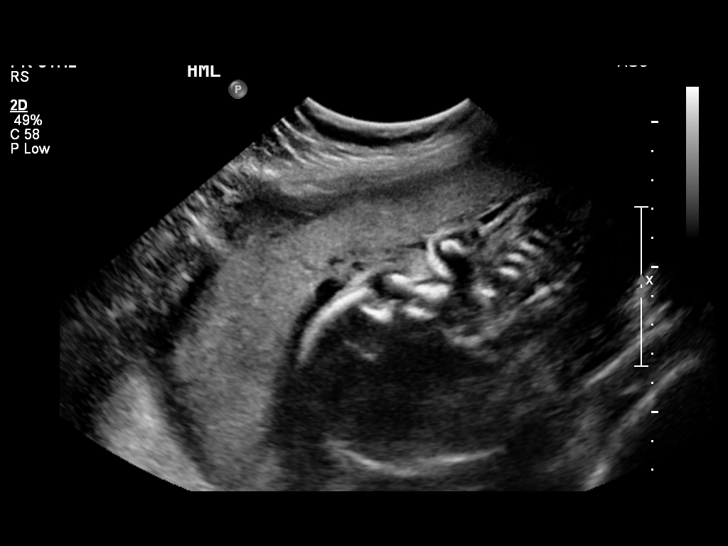
[im 18/97]
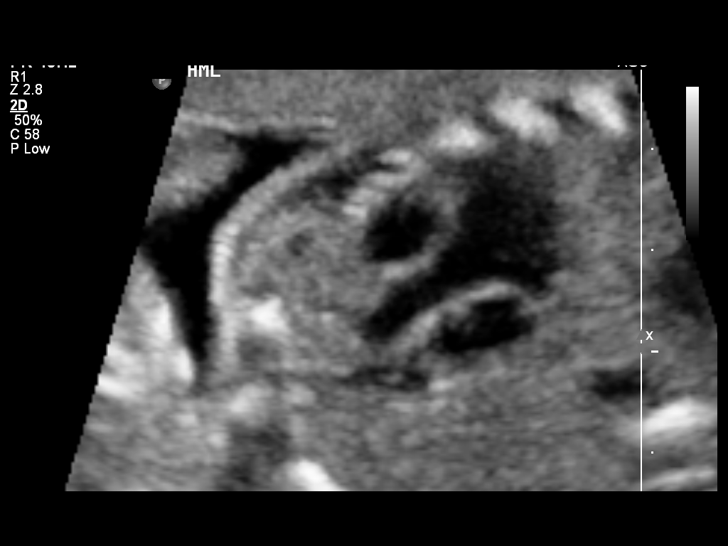
[im 29/97]
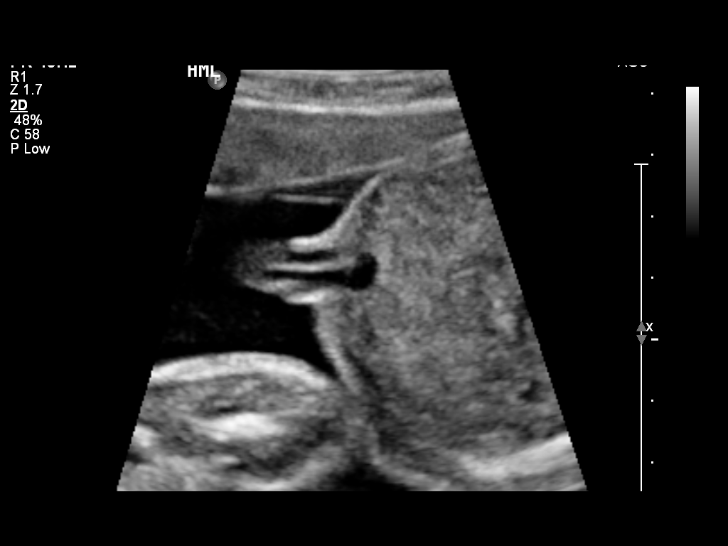
[im 36/97]
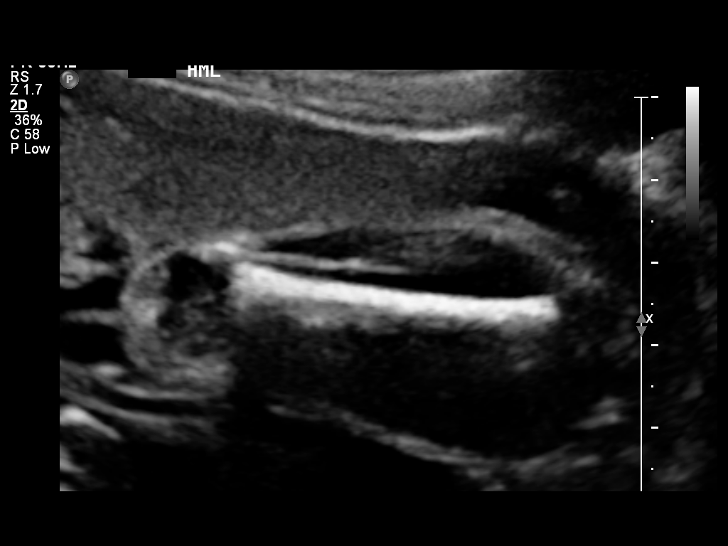
[im 43/97]
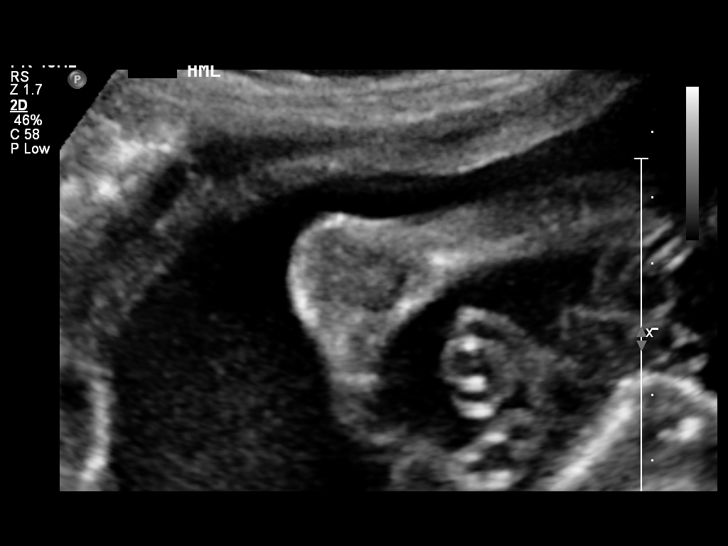
[im 54/97]
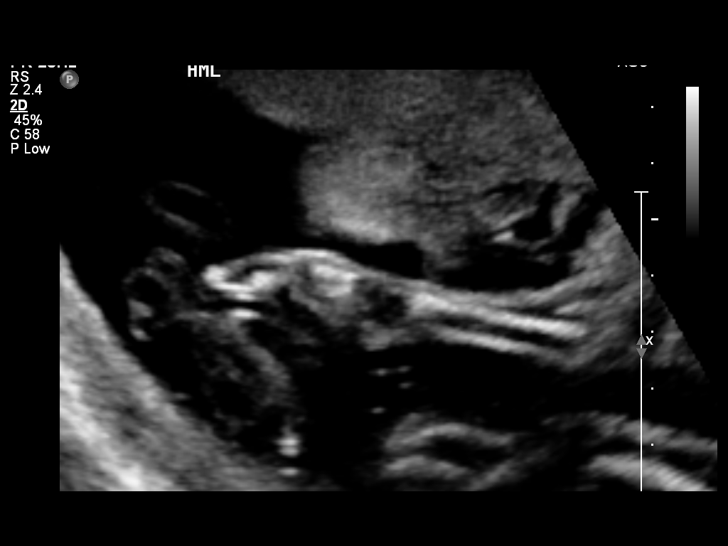
[im 61/97]
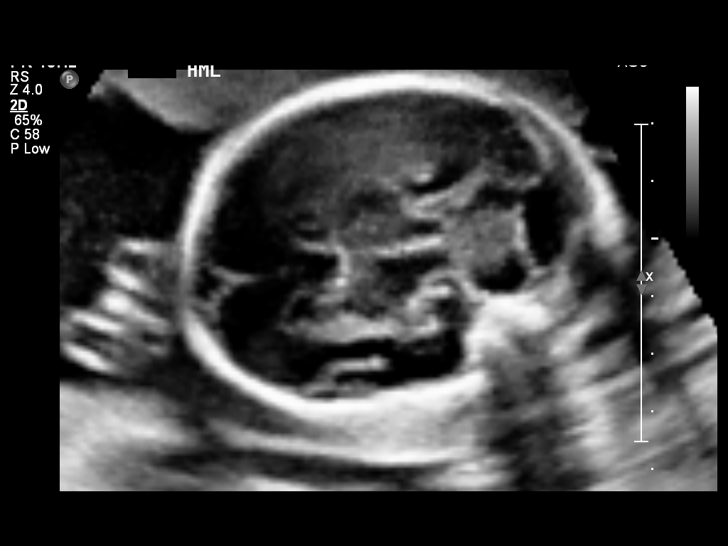
[im 68/97]
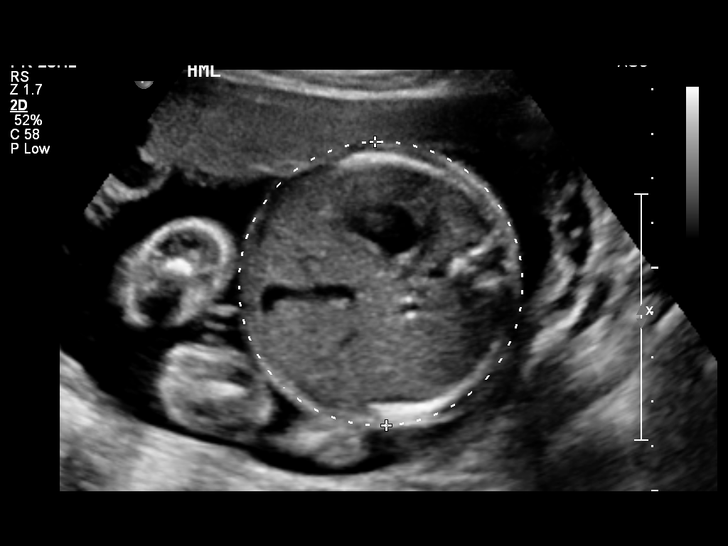
[im 79/97]
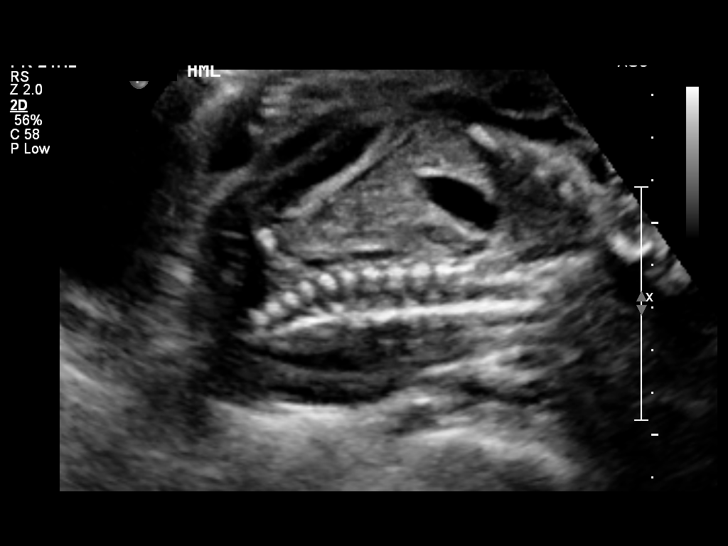
[im 86/97]
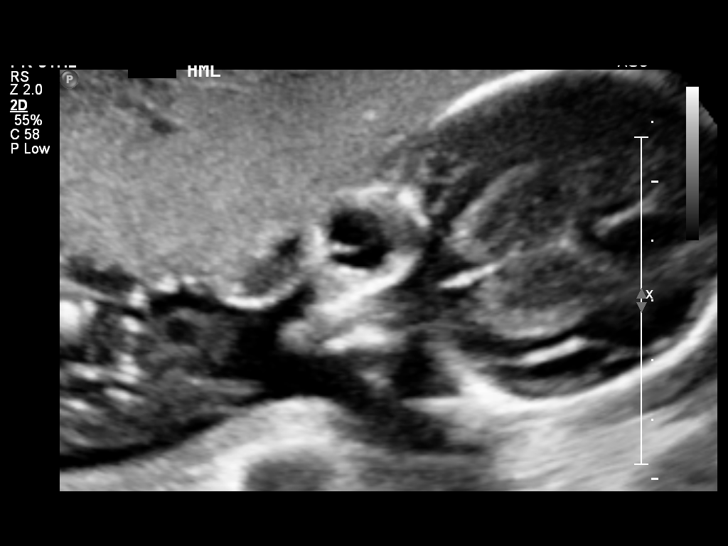
[im 93/97]
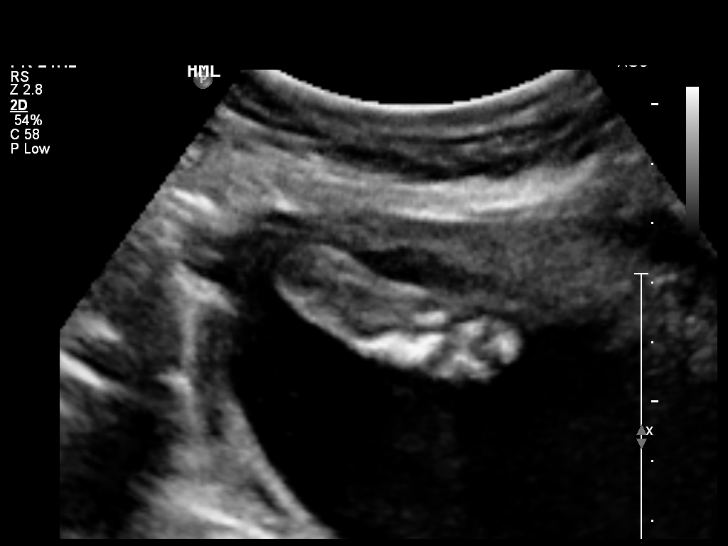

[12 of 28 positions shown; findings below may reference images not displayed]

OBSTETRICS REPORT
                      (Signed Final 01/16/2014 [DATE])

Service(s) Provided

 US OB COMP + 14 WK                                    76805.1
Indications

 Basic anatomic survey
 No or Little Prenatal Care
Fetal Evaluation

 Num Of Fetuses:    1
 Fetal Heart Rate:  138                          bpm
 Cardiac Activity:  Observed
 Presentation:      Transverse, head to
                    maternal left
 Placenta:          Anterior, above cervical os
 P. Cord            Visualized, central
 Insertion:

 Amniotic Fluid
 AFI FV:      Subjectively within normal limits
                                             Larg Pckt:     5.1  cm
Biometry

 BPD:       54  mm     G. Age:  22w 3d                CI:        67.65   70 - 86
                                                      FL/HC:      18.6   19.2 -

 HC:     210.1  mm     G. Age:  23w 1d       45  %    HC/AC:      1.07   1.05 -

 AC:     196.3  mm     G. Age:  24w 2d       83  %    FL/BPD:     72.2   71 - 87
 FL:        39  mm     G. Age:  22w 4d       28  %    FL/AC:      19.9   20 - 24
 HUM:       38  mm     G. Age:  23w 3d       55  %
 CER:     23.3  mm     G. Age:  21w 5d       27  %

 Est. FW:     594  gm      1 lb 5 oz     61  %
Gestational Age

 LMP:           22w 6d        Date:  08/09/13                 EDD:   05/16/14
 U/S Today:     23w 1d                                        EDD:   05/14/14
 Best:          22w 6d     Det. By:  LMP  (08/09/13)          EDD:   05/16/14
Anatomy
 Cranium:          Appears normal         Aortic Arch:      Not well visualized
 Fetal Cavum:      Appears normal         Ductal Arch:      Not well visualized
 Ventricles:       Appears normal         Diaphragm:        Appears normal
 Choroid Plexus:   Appears normal         Stomach:          Appears normal, left
                                                            sided
 Cerebellum:       Appears normal         Abdomen:          Appears normal
 Posterior Fossa:  Appears normal         Abdominal Wall:   Appears nml (cord
                                                            insert, abd wall)
 Nuchal Fold:      Not applicable (>20    Cord Vessels:     Appears normal (3
                   wks GA)                                  vessel cord)
 Face:             Orbits nl; profile not Kidneys:          Appear normal
                   well visualized
 Lips:             Appears normal         Bladder:          Appears normal
 Heart:            Appears normal         Spine:            Appears normal
                   (4CH, axis, and
                   situs)
 RVOT:             Appears normal         Lower             Appears normal
                                          Extremities:
 LVOT:             Appears normal         Upper             Appears normal
                                          Extremities:

 Other:  Heels visualized. Fetus appears to be a female. Technically difficult
         due to fetal position.
Targeted Anatomy

 Fetal Central Nervous System
 Lat. Ventricles:  4.6                    Cisterna Magna:
Cervix Uterus Adnexa

 Cervical Length:    4.24     cm

 Cervix:       Normal appearance by transabdominal scan.
 Uterus:       No abnormality visualized.

 Left Ovary:    Size(cm) L: 1.89 x W: 1.67 x H: 1.02  Volume(cc):
 Right Ovary:   Not visualized.
 Adnexa:     No abnormality visualized. No adnexal mass visualized.
Impression

 SIUP at 22+6 weeks
 Normal detailed fetal anatomy; limited views of arches and
 profile
 Markers of aneuploidy: none
 Normal amniotic fluid volume
 Measurements consistent with LMP dating
Recommendations

 Follow-up as clinically indicated or could bring back for aches
 and profile

## 2015-03-27 ENCOUNTER — Ambulatory Visit: Payer: Self-pay

## 2015-11-13 ENCOUNTER — Encounter (HOSPITAL_COMMUNITY): Payer: Self-pay | Admitting: *Deleted

## 2015-11-21 ENCOUNTER — Ambulatory Visit (HOSPITAL_COMMUNITY)
Admission: RE | Admit: 2015-11-21 | Discharge: 2015-11-21 | Disposition: A | Discharge status: Home / Self Care | Payer: Self-pay | Source: Ambulatory Visit | Attending: Obstetrics and Gynecology | Admitting: Obstetrics and Gynecology

## 2015-11-21 ENCOUNTER — Encounter (HOSPITAL_COMMUNITY): Payer: Self-pay

## 2015-11-21 VITALS — BP 112/68 | Temp 98.9°F | Ht 62.0 in | Wt 118.0 lb

## 2015-11-21 DIAGNOSIS — R87613 High grade squamous intraepithelial lesion on cytologic smear of cervix (HGSIL): Secondary | ICD-10-CM

## 2015-11-21 DIAGNOSIS — Z1239 Encounter for other screening for malignant neoplasm of breast: Secondary | ICD-10-CM

## 2015-11-21 NOTE — Patient Instructions (Addendum)
Educational materials on self breast awareness given. Explained the colposcopy to Brooke [name] the needed follow up for her abnormal Pap smear on 10/30/2015. [facility] for a [facility]. Appointment scheduled for Thursday, December 12, 2015 at 1415. Patient aware of appointment and will be there. Discussed smoking cessation with patient. Referred patient to the [facility] and gave resources to free smoking cessation classes offered at the [facility]. Screening mammogram recommended at age 31 unless clinically indicated prior. Brooke [name] verbalized understanding.  Jeslyn Amsler, Kathaleen Maserhristine Poll, RN 2:50 PM

## 2015-11-21 NOTE — Progress Notes (Signed)
[facility] due to having an abnormal Pap smear 10/30/2015 and a colposcopy being recommended for follow up.  Pap Smear: Pap smear not completed today. [facility] Ref[facility] for a colposcopy. Appointment scheduled for Thursday, December 12, 2015 at 1415. Per patient has no history of abnormal Pap smears prior to the most recent Pap smear. Pap smear result is in EPIC.  Physical exam: Breasts Breasts symmetrical. No skin abnormalities bilateral breasts. No nipple retraction bilateral breasts. No nipple discharge bilateral breasts. No lymphadenopathy. No lumps palpated bilateral breasts. No complaints of pain or tenderness on exam. Screening mammogram recommended at age 30 unless clinically indicated prior.  Pelvic/Bimanual No Pap smear completed today since last Pap smear was 10/30/2015. Pap smear not indicated per BCCCP guidelines.   Smoking History: Patient is a current smoker. Discussed smoking cessation with patient. Referred patient to the [facility] Q[facility] and gave resources to free smoking cessation classes offered at the [facility].  Patient Navigation: Patient education provided. Access to services provided for patient through [facility] program. Spanish interpreter provided.  Used Spanish interpeter [employer]lis Herrera from [employer].

## 2015-11-26 ENCOUNTER — Encounter (HOSPITAL_COMMUNITY): Payer: Self-pay | Admitting: *Deleted

## 2015-12-12 ENCOUNTER — Encounter: Payer: Medicaid Other | Admitting: Obstetrics and Gynecology

## 2015-12-19 ENCOUNTER — Other Ambulatory Visit (HOSPITAL_COMMUNITY)
Admission: RE | Admit: 2015-12-19 | Discharge: 2015-12-19 | Disposition: A | Discharge status: Home / Self Care | Payer: Self-pay | Source: Ambulatory Visit | Attending: Obstetrics & Gynecology | Admitting: Obstetrics & Gynecology

## 2015-12-19 ENCOUNTER — Ambulatory Visit (INDEPENDENT_AMBULATORY_CARE_PROVIDER_SITE_OTHER): Payer: Self-pay | Admitting: Obstetrics & Gynecology

## 2015-12-19 ENCOUNTER — Inpatient Hospital Stay (HOSPITAL_COMMUNITY): Admit: 2015-12-19 | Payer: Self-pay

## 2015-12-19 DIAGNOSIS — R87613 High grade squamous intraepithelial lesion on cytologic smear of cervix (HGSIL): Secondary | ICD-10-CM

## 2015-12-19 DIAGNOSIS — D069 Carcinoma in situ of cervix, unspecified: Secondary | ICD-10-CM | POA: Insufficient documentation

## 2015-12-19 LAB — POCT PREGNANCY, URINE: Preg Test, Ur: NEGATIVE

## 2015-12-19 NOTE — Patient Instructions (Addendum)
Displasia cervical (Cervical Dysplasia) La displasia cervical es una afeccin que ocurre cuando una mujer presenta cambios anormales en las clulas del cuello del tero. El cuello del tero es la abertura del tero. Se encuentra entre la vagina y Nurse, learning disability. La displasia cervical puede ser el primer signo de cncer de cuello del tero.  Casi todos los casos de displasia cervical pueden curarse gracias a la deteccin temprana, el tratamiento y controles rigurosos. Si no se la trata, puede agravarse.  CAUSAS  Una infeccin causada por el virus del Engineer, technical sales (VPH) puede provocar la displasia cervical. FACTORES DE RIESGO   Haber padecido una enfermedad de transmisin sexual, como clamidia o infeccin por el VPH.  Ser sexualmente activa antes de los 18 aos.  Haber tenido ms de 1 compaero sexual.  No usar proteccin [employer] sexuales, especialmente con compaeros sexuales nuevos.  Haber sufrido cncer en la vagina o en la vulva.  Haber tenido un compaero sexual cuya pareja anterior padeci cncer de cuello del tero o displasia cervical.  Tener un compaero sexual que tiene o ha tenido cncer de pene.  Presentar un debilitamiento del sistema inmunitario (a causa del VIH o de un trasplante de rgano).  Ser hija de una mujer que tom dietilestilbestrol (DES) durante el embarazo.  Tener antecedentes familiares de cncer de cuello del tero.  El hbito de fumar. SIGNOS Y SNTOMAS  Generalmente, no se presentan sntomas. Si hay sntomas, estos pueden incluir:   Flujo vaginal anormal.  Sangrado entre perodos menstruales o despus de Retail banker.  Sangrado durante la menopausia.  [place] (dispareunia). DIAGNSTICO  Se puede realizar una prueba de Papanicolaou. Durante esta prueba, se extraen clulas del cuello del tero y luego se analizan con un microscopio. Tambin puede realizarse una prueba en la que se extirpa  tejido del cuello del tero (biopsia), si la prueba de Papanicolaou es anormal o si el aspecto del cuello del tero es anormal.  TRATAMIENTO  El tratamiento vara en funcin de la gravedad de la displasia cervical. El tratamiento puede incluir:  Crioterapia. Durante la crioterapia, las clulas anormales se congelan con un instrumento con punta de acero.  Un procedimiento para extirpar tejido anormal del cuello del tero.  Ciruga para extirpar tejido anormal. Generalmente, se lleva a cabo en casos graves de displasia cervical. Las opciones quirrgicas son:  Biopsia en cono. Este es un procedimiento en el que se extirpan el canal cervical y Ardelia Mems parte del centro del cuello del tero.  Histerectoma En esta ciruga se extirpan el tero y el cuello del tero. INSTRUCCIONES PARA EL CUIDADO EN EL HOGAR   Slo tome medicamentos de venta libre o recetados para Glass blower/designer o Health and safety inspector, segn las indicaciones de su mdico.  No utilice tampones, duchas vaginales ni tenga relaciones sexuales hasta que el profesional la autorice.  Cumpla con todas las visitas de control, segn le indique su mdico. Las mujeres que han recibido tratamiento para la displasia cervical deberan someterse a pruebas de Papanicolaou y exmenes plvicos de forma regular. Durante el primer ao despus del tratamiento de la displasia cervical, la prueba de Papanicolaou se debe realizar cada 3 a 4 meses. En el segundo ao, se debe realizar cada 6 meses o segn lo recomendado por el mdico.  Para evitar la recurrencia de la afeccin, practique el sexo seguro. SOLICITE ATENCIN MDICA SI:  Presenta verrugas genitales.  SOLICITE ATENCIN MDICA DE INMEDIATO SI:   El perodo menstrual  es ms abundante que lo normal.  Presenta un sangrado rojo brillante, especialmente si tiene cogulos sanguneos.  Tiene fiebre.  Siente clicos o dolor que Lesothoaumenta y no se alivia con medicamentos.  Se siente mareada, inusualmente dbil o  se desmaya.  Tiene flujo vaginal anormal.  Siente dolor abdominal.   Esta informacin no tiene como fin reemplazar el consejo del mdico. Asegrese de hacerle al mdico cualquier pregunta que tenga.   Document Released: 05/24/2013 Elsevier Interactive Patient Education 2016 [employer]. Colposcopa - Cuidados posteriores (Colposcopy, Care After) Siga estas instrucciones durante las prximas semanas. Estas indicaciones le proporcionan informacin general acerca de cmo deber cuidarse despus del procedimiento. El mdico tambin podr darle instrucciones ms especficas. El tratamiento se ha planificado de acuerdo a las prcticas mdicas actuales, pero a veces se producen problemas. Comunquese con el mdico si tiene algn problema o tiene dudas despus del procedimiento. QU ESPERAR DESPUS DEL PROCEDIMIENTO  Despus del procedimiento, es tpico tener las siguientes sensaciones:  Clicos. Generalmente se calman en algunos minutos.  Dolor. Puede durar [facility] dos das.  Aturdimiento. Si esto le ocurre, recustese durante algunos minutos. Podr tener un sangrado leve o una secrecin oscura que debe detenerse en Lastrupalgunos das. Durante este tiempo deber usar un apsito sanitario. INSTRUCCIONES PARA EL CUIDADO EN EL HOGAR  Evite las [address] Nerelaciones sexuales, las duchas vaginales y el uso de tampones durante [address]  [address], o segn lo que le indique su mdico.  Tome slo medicamentos de venta libre o recetados, segn las indicaciones del mdico. No tome aspirina, ya que puede causar hemorragias.  Si utiliza pldoras anticonceptivas, contine tomndolas.  No todos los resultados estarn disponibles durante su visita. En este caso, tenga otra entrevista con su mdico para conocerlos. No suponga que es normal si no tiene noticias de su mdico o del establecimiento de salud. Es Copyimportante el seguimiento de todos los [place]resultados de [place]os estudios.  Siga los consejos de su mdico con respecto a los Roodhousemedicamentos,  Juana Di­azactividades, visitas y Papanicolau de control. SOLICITE ATENCIN MDICA SI:  Aparece una erupcin cutnea.  Tiene problemas con los medicamentos. SOLICITE ATENCIN MDICA DE INMEDIATO SI:  Tiene una hemorragia abundante o elimina cogulos.  Tiene fiebre.  Tiene flujo vaginal anormal.  Tiene clicos que no se alivian luego de tomar analgsicos.  Se siente mareada, tiene vahdos o se desmaya.  Siente Physiological scientistdolor en el estmago.   Esta informacin no tiene Theme park managercomo fin reemplazar el consejo del mdico. Asegrese de hacerle al mdico cualquier pregunta que tenga.   Document Released: 05/24/2013 Elsevier Interactive Patient Education [employer]2016 Elsevier Inc.

## 2015-12-19 NOTE — Progress Notes (Signed)
Patient ID: Brooke [name], female   DOB: Oct 10, 1985, 30 y.o.   MRN: [id number]  Chief Complaint  Patient presents with  . Colposcopy    HPI Brooke [name] is a 30 y.o. female.  [id number] Patient's last menstrual period was 10/26/2015 (exact date). S/P Nexplanon with spotting.   HPI  Indications: Pap smear on March 2017 showed: high-grade squamous intraepithelial neoplasia  (HGSIL-encompassing moderate and severe dysplasia). Previous colposcopy: no. Prior cervical treatment: no treatment.  No past medical history on file.  No past surgical history on file.  No family history on file.  Social History Social History  Substance Use Topics  . Smoking status: Current Some Day Smoker  . Smokeless tobacco: Not on file  . Alcohol Use: No    No Known Allergies  Current Outpatient Prescriptions  Medication Sig Dispense Refill  . Prenatal Vit-Fe Fumarate-FA (PRENATAL MULTIVITAMIN) TABS tablet Take 1 tablet by mouth daily at 12 noon.     No current facility-administered medications for this visit.    Review of Systems Review of Systems  Last menstrual period 10/26/2015, unknown if currently breastfeeding.  Physical Exam Physical Exam  Data Reviewed Pap result  Assessment    Procedure Details  The risks and benefits of the procedure and Written informed consent obtained.  Speculum placed in vagina and excellent visualization of cervix achieved, cervix swabbed x 3 with acetic acid solution. SCJ seen and TZ, AWE anterior cervix and small area at 6, no endocervical lesion Specimens: ECC and Bx at 12 and 6  Complications: none.     Plan    Specimens labelled and sent to Pathology. Call to discuss Pathology results in 2 weeks, may need LEEP or cryotherapy if HSIL      Kyson Kupper 12/19/2015, 4:02 PM

## 2015-12-25 ENCOUNTER — Telehealth: Payer: Self-pay | Admitting: General Practice

## 2015-12-25 NOTE — Telephone Encounter (Signed)
Patient needs LEEP. Called patient with pacific interpreter [phone], no answer- left message stating we are trying to reach you with results, please call us back at the clinics

## 2015-12-31 NOTE — Telephone Encounter (Signed)
Brooke [name] called earlier to front office, I informed front office I would call her with interpreter later today.  [facility] [phone].  I left a message I was returning her call, please call clinic.

## 2016-01-08 ENCOUNTER — Encounter: Payer: Self-pay | Admitting: General Practice

## 2016-01-08 NOTE — Telephone Encounter (Signed)
Patient called and left message that she is returning our call. Called patient & informed her of results & recommendations. Also informed patient of appt. Patient verbalized understanding to all & had no questions

## 2016-01-08 NOTE — Telephone Encounter (Signed)
Called patient with pacific interpreter [phone], no answer- left message on her number & emergency contact number to call us back at the clinics for results. Will send letter.

## 2016-01-31 ENCOUNTER — Encounter: Payer: Self-pay | Admitting: Obstetrics & Gynecology

## 2016-01-31 ENCOUNTER — Ambulatory Visit (INDEPENDENT_AMBULATORY_CARE_PROVIDER_SITE_OTHER): Payer: Self-pay | Admitting: Obstetrics & Gynecology

## 2016-01-31 VITALS — BP 127/79 | HR 80 | Wt 117.0 lb

## 2016-01-31 DIAGNOSIS — D069 Carcinoma in situ of cervix, unspecified: Secondary | ICD-10-CM | POA: Insufficient documentation

## 2016-01-31 DIAGNOSIS — Z3202 Encounter for pregnancy test, result negative: Secondary | ICD-10-CM

## 2016-01-31 LAB — POCT PREGNANCY, URINE: PREG TEST UR: NEGATIVE

## 2016-01-31 NOTE — Patient Instructions (Signed)
Procedimiento de escisin electroquirrgica con asa (Loop Electrosurgical Excision Procedure) El procedimiento de escisin electroquirrgica con asa es la extirpacin de una porcin de la parte inferior del tero (cuello). El procedimento se realiza cuando hay cambios significativamente anormales en las clulas del cuello del tero. Estos cambios pueden causar cncer si se dejan sin tratar.   El procedimiento mismo slo demora algunos minutos. Generalmente se realiza en el consultorio del mdico. Se considera un procedimiento seguro para aquellas mujeres que desean o tratan de quedar embarazadas. Solo bajo raras circunstancias este procedimiento se realiza estando embarazada.  INFORME A SU MDICO:   Si est embarazada o no tuvo el ltimo perodo menstrual.  Alergias a alimentos o medicamentos.  Todos los medicamentos que utiliza, incluyendo vitaminas, hierbas, gotas oftlmicas, medicamentos de venta libre y cremas.  Uso de corticoides (por va oral o cremas).  Problemas anteriores debido a anestsicos o a medicamentos que disminuyen la sensibilidad.  Cirugas ginecolgicas previas.  Antecedentes de hemorragias o cogulos sanguneos.  Infecciones recientes o actuales en la vagina (herpes, enfermedades de transmisin sexual).  Otros problemas de salud. RIESGOS Y COMPLICACIONES  Sangrado.  Infecciones.  Lesiones en la vagina, la vejiga o el recto.  Obstruccin rara en la abertura del cuello que causa problemas durante la menstruacin (estenosis cervical). ANTES DEL PROCEDIMIENTO   No tome aspirina ni anticoagulantes durante la semana previa al procedimiento, o segn le hayan indicado.  Consuma una comida ligera antes del procedimiento.  Consulte a su mdico si debe cambiar o suspender los medicamentos que toma habitualmente.  Le administrarn un analgsico 1  2 horas antes del procedimiento. PROCEDIMIENTO   Se coloca un instrumento (espculo) en la vagina. Esto le permite  al mdico observar el cuello.  Se aplica tintura de yodo para encontrar la zona de las clulas anormales.  Se inyecta un medicamento para adormecer el cuello (anestsico local).   Se pasa electricidad a travs de una delgada asa de alambre que luego se usa para extirpar (cauterizar) un pequeo segmento de la zona afectada.  Se utiliza una ligera electrocauterizacin para sellar los vasos sanguneos pequeos y evitar el sangrado.  Aplicarn una pasta en la zona cauterizada para prevenir el sangrado.  La muestra de tejido se enva al laboratorio. Luego, se examina en el microscopio. DESPUS DEL PROCEDIMIENTO   Pdale a alguna persona que la lleve hasta su casa.  Puede sentir un clico moderado a suave.  Puede notar una secrecin vaginal negra por la pasta usada para prevenir el sangrado. Esto es normal.  Observe si tiene un sangrado excesivo. Esto requiere atencin mdica inmediata.  Consulte con su mdico la fecha en que los resultados estarn disponibles. Asegrese de obtener los resultados.   Esta informacin no tiene como fin reemplazar el consejo del mdico. Asegrese de hacerle al mdico cualquier pregunta que tenga.   Document Released: 04/15/2011 Document Revised: 10/26/2011 Elsevier Interactive Patient Education 2016 Elsevier Inc.  

## 2016-01-31 NOTE — Progress Notes (Signed)
[id number] Patient's last menstrual period was 12/31/2015 (approximate). 30 y.o. 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 Consent was given and time out. She received lidocaine with epinephrine intracervical but the loop would not activate so we could not proceed. She will reschedule. I apologized for the inconvenience.  Adam PhenixJames G Arnold, MD 01/31/2016

## 2016-02-10 ENCOUNTER — Encounter: Payer: Self-pay | Admitting: Family Medicine

## 2016-02-20 ENCOUNTER — Ambulatory Visit (INDEPENDENT_AMBULATORY_CARE_PROVIDER_SITE_OTHER): Payer: Self-pay | Admitting: Obstetrics & Gynecology

## 2016-02-20 ENCOUNTER — Other Ambulatory Visit (HOSPITAL_COMMUNITY)
Admission: RE | Admit: 2016-02-20 | Discharge: 2016-02-20 | Disposition: A | Discharge status: Home / Self Care | Payer: Self-pay | Source: Ambulatory Visit | Attending: Obstetrics & Gynecology | Admitting: Obstetrics & Gynecology

## 2016-02-20 ENCOUNTER — Encounter: Payer: Self-pay | Admitting: Obstetrics & Gynecology

## 2016-02-20 ENCOUNTER — Encounter: Payer: Self-pay | Admitting: Clinical

## 2016-02-20 VITALS — BP 118/69 | HR 67 | Temp 99.5°F | Ht 62.0 in | Wt 116.1 lb

## 2016-02-20 DIAGNOSIS — Z3202 Encounter for pregnancy test, result negative: Secondary | ICD-10-CM

## 2016-02-20 DIAGNOSIS — D069 Carcinoma in situ of cervix, unspecified: Secondary | ICD-10-CM

## 2016-02-20 LAB — POCT PREGNANCY, URINE: Preg Test, Ur: NEGATIVE

## 2016-02-20 NOTE — Patient Instructions (Signed)
Conization of the Cervix, Care After These instructions give you information on caring for yourself after your procedure. Your doctor may also give you more specific instructions. Call your doctor if you have any problems or questions after your procedure. HOME CARE   Have someone drive you home after the procedure.  Only take medicines as told by your doctor. Do not take aspirin. It can cause bleeding.  Take showers for the first week. Do not take baths, swim, or use hot tubs until your doctor says it is okay.  Do not douche, use tampons, or have sex (sexual intercourse) until your doctor says it is okay.  For 7-14 days, avoid:  Being very active.  Exercising.  Heavy lifting.  Resume your usual diet unless your doctor tells you differently.  If you cannot poop (you are constipated), you may:  Take a mild laxative as told by your doctor.  Add fruit and bran to your diet.  Make sure to drink enough fluids to keep your pee (urine) clear or pale yellow.  Keep your follow-up appointments as told by your doctor. GET HELP IF:  You have a rash.  You are dizzy or lightheaded.  You feel sick to your stomach (nauseous).  You develop a bad smelling vaginal discharge. GET HELP RIGHT AWAY IF:   You have blood clots or bleeding that is heavier than a normal menstrual period. For example, you soak a pad in less than 1 hour.  You have bright red bleeding.  You have a fever over 101F (38.3C) or lasting symptoms for more than 2-3 days.  You have a fever over 101F (38.3C) and your symptoms suddenly get worse.  You have increasing cramps.  You pass out (faint).  You have pain when peeing.  You have bloody pee.  You start throwing up (vomiting).  Your pain does not get better when you take your medicine.  Your have a lot of pain.  Your pain is getting worse. MAKE SURE YOU:  Understand these instructions.  Will watch your condition.  Will get help right away if  you are not doing well or get worse.   This information is not intended to replace advice given to you by your health care provider. Make sure you discuss any questions you have with your health care provider.   Document Released: 05/12/2008 Document Revised: 08/08/2013 Document Reviewed: 01/27/2013 Elsevier Interactive Patient Education 2016 Elsevier Inc.  

## 2016-02-20 NOTE — Progress Notes (Signed)
  30 y.o. [id number] Patient's last menstrual period was 02/07/2016. F/U for CIN 3 on biopsy in May Patient identified, informed consent obtained, signed copy in chart, time out performed.  Pap smear and colposcopy reviewed.   Pap HSIL Colpo Biopsy CIN3 ECC negative  Teflon coated speculum with smoke evacuator placed.  Cervix visualized. Paracervical block placed.  medium size Fisher loop used to remove cone of cervix using blend of cut and cautery on LEEP machine.  Edges/Base cauterized with ball.  Monsel's solution used for hemostasis.  Patient tolerated procedure well.  Patient given post procedure instructions.  Follow up in 12 months for repeat pap or as needed.  Adam PhenixJames G Elleana Stillson, MD 02/20/2016

## 2016-02-26 ENCOUNTER — Encounter: Payer: Self-pay | Admitting: *Deleted

## 2016-02-27 ENCOUNTER — Telehealth: Payer: Self-pay | Admitting: General Practice

## 2016-02-27 NOTE — Telephone Encounter (Signed)
Per Dr Debroah LoopArnold, patient's colpo shows cin2-3. Patient needs pap in one year. Called patient with [employer] for interpreter & informed patient of results & recommendations. Patient verbalized understanding & had no questions

## 2016-06-02 ENCOUNTER — Telehealth (HOSPITAL_COMMUNITY): Payer: Self-pay | Admitting: *Deleted

## 2016-06-02 NOTE — Telephone Encounter (Signed)
Called patient with interpreter Brooke [name] due to patient called about bill received for [facility]. Let her know that her visit 12/19/2015 is covered by [facility]. Informed her that she will need to complete financial assistance for her visit 01/31/2016. Patient given number to patient accounting to find out if eligible for financial assistance or what options she has. Patient verbalized understanding.

## 2017-03-26 ENCOUNTER — Telehealth (HOSPITAL_COMMUNITY): Payer: Self-pay | Admitting: *Deleted

## 2017-03-26 NOTE — Telephone Encounter (Signed)
Attempted to call patient with Spanish interpreter Betty Shirley from [facility] to schedule patient's Pap smear appointment with [facility]. No one answered phone and unable to leave a message. 

## 2019-11-02 ENCOUNTER — Emergency Department (HOSPITAL_COMMUNITY): Payer: Self-pay

## 2019-11-02 ENCOUNTER — Encounter (HOSPITAL_COMMUNITY): Payer: Self-pay

## 2019-11-02 ENCOUNTER — Emergency Department (HOSPITAL_COMMUNITY)
Admission: EM | Admit: 2019-11-02 | Discharge: 2019-11-02 | Disposition: A | Discharge status: Home / Self Care | Payer: Self-pay | Attending: Emergency Medicine | Admitting: Emergency Medicine

## 2019-11-02 ENCOUNTER — Other Ambulatory Visit: Payer: Self-pay

## 2019-11-02 DIAGNOSIS — O209 Hemorrhage in early pregnancy, unspecified: Secondary | ICD-10-CM

## 2019-11-02 DIAGNOSIS — Z3A08 8 weeks gestation of pregnancy: Secondary | ICD-10-CM | POA: Insufficient documentation

## 2019-11-02 DIAGNOSIS — Z87891 Personal history of nicotine dependence: Secondary | ICD-10-CM | POA: Insufficient documentation

## 2019-11-02 DIAGNOSIS — O034 Incomplete spontaneous abortion without complication: Secondary | ICD-10-CM

## 2019-11-02 DIAGNOSIS — O039 Complete or unspecified spontaneous abortion without complication: Secondary | ICD-10-CM | POA: Insufficient documentation

## 2019-11-02 LAB — CBC WITH DIFFERENTIAL/PLATELET
Abs Immature Granulocytes: 0.03 10*3/uL (ref 0.00–0.07)
Basophils Absolute: 0 10*3/uL (ref 0.0–0.1)
Basophils Relative: 0 %
Eosinophils Absolute: 0 10*3/uL (ref 0.0–0.5)
Eosinophils Relative: 0 %
HCT: 36.6 % (ref 36.0–46.0)
Hemoglobin: 13.1 g/dL (ref 12.0–15.0)
Immature Granulocytes: 0 %
Lymphocytes Relative: 23 %
Lymphs Abs: 2.1 10*3/uL (ref 0.7–4.0)
MCH: 34.8 pg — ABNORMAL HIGH (ref 26.0–34.0)
MCHC: 35.8 g/dL (ref 30.0–36.0)
MCV: 97.3 fL (ref 80.0–100.0)
Monocytes Absolute: 0.6 10*3/uL (ref 0.1–1.0)
Monocytes Relative: 6 %
Neutro Abs: 6.4 10*3/uL (ref 1.7–7.7)
Neutrophils Relative %: 71 %
Platelets: 226 10*3/uL (ref 150–400)
RBC: 3.76 MIL/uL — ABNORMAL LOW (ref 3.87–5.11)
RDW: 11.5 % (ref 11.5–15.5)
WBC: 9.1 10*3/uL (ref 4.0–10.5)
nRBC: 0 % (ref 0.0–0.2)

## 2019-11-02 LAB — BASIC METABOLIC PANEL
Anion gap: 9 (ref 5–15)
BUN: 8 mg/dL (ref 6–20)
CO2: 23 mmol/L (ref 22–32)
Calcium: 9.3 mg/dL (ref 8.9–10.3)
Chloride: 104 mmol/L (ref 98–111)
Creatinine, Ser: 0.59 mg/dL (ref 0.44–1.00)
GFR calc Af Amer: >60 mL/min (ref >60)
GFR calc non Af Amer: >60 mL/min (ref >60)
Glucose, Bld: 84 mg/dL (ref 70–99)
Potassium: 3.5 mmol/L (ref 3.5–5.1)
Sodium: 136 mmol/L (ref 135–145)

## 2019-11-02 LAB — ABO/RH: ABO/RH(D): O POS

## 2019-11-02 LAB — I-STAT BETA HCG BLOOD, ED (MC, WL, AP ONLY): I-stat hCG, quantitative: >2000 m[IU]/mL — ABNORMAL HIGH (ref <5)

## 2019-11-02 NOTE — ED Provider Notes (Signed)
[facility]-EMERGENCY DEPT Provider Note   CSN: [id number] Arrival date & time: 11/02/19  1516     History Chief Complaint  Patient presents with  . Vaginal Bleeding    Brooke [name] is a 34 y.o. female.  HPI   Patient is G3, P2.  LMP was January 12.  She has 2 healthy children at home patient had a home positive pregnancy test on February 13.  She has not had any prenatal care yet.  Patient states she had an episode of bright red vaginal bleeding with a clot at about 1:30 today.  She denies any abdominal pain.  No lightheadedness.  She has a mild headache but otherwise no other complaints  History reviewed. No pertinent past medical history.  Patient Active Problem List   Diagnosis Date Noted  . 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 No pertinent surgical history.   OB History    Gravida  2   Para  2   Term  2   Preterm      AB      Living  2     SAB      TAB      Ectopic      Multiple      Live Births  1           History reviewed. No pertinent family history.  Social History   Tobacco Use  . Smoking status: Former Smoker    Types: Cigarettes  . Smokeless tobacco: Never Used  Substance Use Topics  . Alcohol use: No  . Drug use: No    Home Medications Prior to Admission medications   Medication Sig Start Date End Date Taking? Authorizing Provider  acetaminophen (TYLENOL) 500 MG tablet Take 500 mg by mouth every 6 (six) hours as needed for moderate pain or headache.   Yes [provider]  Prenatal Vit-Fe Fumarate-FA (PRENATAL MULTIVITAMIN) TABS tablet Take 1 tablet by mouth daily at 12 noon.   Yes [provider]    Allergies    Patient has no known allergies.  Review of Systems   Review of Systems  All other systems reviewed and are negative.   Physical Exam Updated Vital Signs BP 140/83 (BP Location:  Right Arm)   Pulse 71   Temp 98.1 F (36.7 C) (Oral)   Resp 16   Ht 1.549 m (5\' 1" )   Wt 54.4 kg   LMP 08/29/2019   SpO2 100%   BMI 22.67 kg/m   Physical Exam Vitals and nursing note reviewed.  Constitutional:      General: She is not in acute distress.    Appearance: She is well-developed.  HENT:     Head: Normocephalic and atraumatic.     Right Ear: External ear normal.     Left Ear: External ear normal.  Eyes:     General: No scleral icterus.       Right eye: No discharge.        Left eye: No discharge.     Conjunctiva/sclera: Conjunctivae normal.  Neck:     Trachea: No tracheal deviation.  Cardiovascular:     Rate and Rhythm: Normal rate and regular rhythm.  Pulmonary:     Effort: Pulmonary effort is normal. No respiratory distress.     Breath sounds: Normal breath sounds. No stridor. No wheezing or rales.  Abdominal:  General: Bowel sounds are normal. There is no distension.     Palpations: Abdomen is soft.     Tenderness: There is no abdominal tenderness. There is no guarding or rebound.  Genitourinary:    Exam position: Supine.     Labia:        Right: No rash, tenderness or lesion.        Left: No rash, tenderness or lesion.      Vagina: Bleeding present.     Cervix: Cervical bleeding present. No cervical motion tenderness or discharge.     Uterus: Enlarged. Not tender.      Adnexa:        Right: No mass, tenderness or fullness.         Left: No mass, tenderness or fullness.    Musculoskeletal:        General: No tenderness.     Cervical back: Neck supple.  Skin:    General: Skin is warm and dry.     Findings: No rash.  Neurological:     Mental Status: She is alert.     Cranial Nerves: No cranial nerve deficit (no facial droop, extraocular movements intact, no slurred speech).     Sensory: No sensory deficit.     Motor: No abnormal muscle tone or seizure activity.     Coordination: Coordination normal.     ED Results / Procedures / Treatments    Labs (all labs ordered are listed, but only abnormal results are displayed) Labs Reviewed  CBC WITH DIFFERENTIAL/PLATELET - Abnormal; Notable for the following components:      Result Value   RBC 3.76 (*)    MCH 34.8 (*)    All other components within normal limits  I-STAT BETA HCG BLOOD, ED ([facility], [facility], [facility] ONLY) - Abnormal; Notable for the following components:   I-stat hCG, quantitative >2,000.0 (*)    All other components within normal limits  BASIC METABOLIC PANEL  ABO/RH  GC/CHLAMYDIA PROBE AMP ([place]) NOT AT [facility]    EKG None  Radiology US OB LESS THAN 14 WEEKS WITH OB TRANSVAGINAL  Result Date: 11/02/2019 CLINICAL DATA:  Vaginal bleeding. Pregnant patient. Patient is 9 weeks and 2 days pregnant based on her last menstrual period. Quantitative beta HCG level of 2000. EXAM: OBSTETRIC <14 WK [place] AND TRANSVAGINAL OB US TECHNIQUE: Both transabdominal and transvaginal ultrasound examinations were performed for complete evaluation of the gestation as well as the maternal uterus, adnexal regions, and pelvic cul-de-sac. Transvaginal technique was performed to assess early pregnancy. COMPARISON:  None. FINDINGS: Intrauterine gestational sac: Single Yolk sac:  Not Visualized. Embryo:  Visualized. Cardiac Activity: Not Visualized. Heart Rate: Not applicable CRL:  1.7 cm   8 w   1 d                  [place] EDC: 06/12/2020 Subchorionic hemorrhage:  None visualized. Maternal uterus/adnexae: Small cystic space adjacent to the gestational sac in the anterior mid uterine segment. Small subchronic hemorrhage. Normal ovaries and adnexa. No pelvic free fluid. IMPRESSION: 1. 17 mm embryo with no detectable heart beat. No yolk sac. Findings meet definitive criteria for failed pregnancy. This follows SRU consensus guidelines: Diagnostic Criteria for Nonviable Pregnancy Early in the First Trimester. Alison Stalling J Med [phone]. 2. Small subchronic hemorrhage. 3. Normal ovaries and adnexa. Electronically Signed    By: Lajean Manes M.D.   On: 11/02/2019 18:22    Procedures Procedures (including critical care time)  Medications Ordered in  ED Medications - No data to display  ED Course  I have reviewed the triage vital signs and the nursing notes.  Pertinent labs & imaging results that were available during my care of the patient were reviewed by me and considered in my medical decision making (see chart for details).  Clinical Course as of Nov 01 1833  Thu Nov 02, 2019  0737 Ultrasound shows 8-week 1 day intrauterine pregnancy without any heartbeat.   [JK]    Clinical Course User Index [JK] Linwood Dibbles, MD   MDM Rules/Calculators/A&P                      Patient remains hemodynamically stable.  She did have some vaginal bleeding on pelvic exam but is not having heavy bleeding at this time.  Unfortunately her ultrasound showed shows an intrauterine pregnancy at 8 weeks without a heartbeat.  This is consistent with an inevitable spontaneous abortion.  Discussed outpatient management.  Discussed warning signs precautions.  Discussed outpatient follow-up with OB/GYN. Final Clinical Impression(s) / ED Diagnoses Final diagnoses:  Inevitable spontaneous abortion    Rx / DC Orders ED Discharge Orders    None       Linwood Dibbles, MD 11/02/19 1836

## 2019-11-02 NOTE — Discharge Instructions (Signed)
The ultrasound showed an intrauterine pregnancy however there was no heartbeat.  Unfortunately it appears you are having a miscarriage.  Take tylenol or ibuprofen as needed for cramping.  Follow up with an OB doctor.  [facility] as needed for heavy bleeding, lightheadedness, weakness

## 2019-11-02 NOTE — ED Triage Notes (Signed)
Patient states she had a home pregnancy test on 09/30/19 and was +. Patient states she had bright red vaginal bleeding with a clot at 1330 today.\  Patient states she has had no prenatal care.

## 2020-08-17 NOTE — L&D Delivery Note (Addendum)
LABOR COURSE Arrived at [facility] at 7:34am with contractions and reported clear leakage of fluid at 0620. She was evaluated at 3/100%/-1 and admitted to labor and delivery. Labor was managed expectantly. Fentanyl was given once for pain control. She progressed to complete with urge to push without complication.  Delivery Note Called to room and patient was complete and pushing. Head delivered with no complication. No nuchal cord present. Shoulder and body delivered with gentle traction. Shoulders delivered simultaneously. At 1301 a healthy female was delivered via Vaginal, Spontaneous (Presentation: OA).  Infant with spontaneous cry, placed on mother's abdomen, dried and stimulated. Cord clamped x 2 after 1-minute delay, and cut by father of child. Cord blood drawn. Placenta delivered spontaneously with gentle cord traction. Appears intact. Fundus firm with massage and Pitocin. Labia, perineum, vagina, and cervix inspected with minor, hemostatic abrasions to posterior vagina, and bilateral paraurethral area. No sutures required.    APGAR: 7, 9 Weight: 9 lbs 10.7 oz (4386 g)   Cord: 3VC with no complications.   Cord pH: pending  Anesthesia:   Episiotomy: None Lacerations: minor, hemostatic abrasions noted at posterior vagina, and bilateral paraurethral areas. Suture Repair: none Est. Blood Loss (mL): 350  Mom to postpartum.  Baby to Couplet care / Skin to Skin.  Reuel Boom, PGY-1, Faculty Service 05/14/2021 1:21 PM     I was gloved and present for entire delivery Delivery was uneventful and tolerated well by patient No difficulty with shoulders Placenta delivered spontaneously and grossly intact Perineum as described above. Suturing not indicated. Agree with delivery note above.  Raelyn Mora, CNM 05/14/2021, 2:15 PM

## 2020-12-05 LAB — OB RESULTS CONSOLE HIV ANTIBODY (ROUTINE TESTING): HIV: NONREACTIVE

## 2020-12-05 LAB — OB RESULTS CONSOLE ANTIBODY SCREEN: Antibody Screen: NEGATIVE

## 2020-12-05 LAB — OB RESULTS CONSOLE RPR: RPR: NONREACTIVE

## 2020-12-05 LAB — OB RESULTS CONSOLE RUBELLA ANTIBODY, IGM: Rubella: IMMUNE

## 2020-12-05 LAB — OB RESULTS CONSOLE ABO/RH: RH Type: POSITIVE

## 2020-12-05 LAB — OB RESULTS CONSOLE GC/CHLAMYDIA
Chlamydia: NEGATIVE
Gonorrhea: NEGATIVE

## 2020-12-05 LAB — OB RESULTS CONSOLE HEPATITIS B SURFACE ANTIGEN: Hepatitis B Surface Ag: NEGATIVE

## 2021-04-24 LAB — OB RESULTS CONSOLE GBS: GBS: POSITIVE

## 2021-05-14 ENCOUNTER — Other Ambulatory Visit: Payer: Self-pay

## 2021-05-14 ENCOUNTER — Inpatient Hospital Stay (HOSPITAL_COMMUNITY)
Admission: AD | Admit: 2021-05-14 | Discharge: 2021-05-16 | DRG: 807 | Disposition: A | Discharge status: Home / Self Care | Payer: Medicaid Other | Attending: Family Medicine | Admitting: Family Medicine

## 2021-05-14 ENCOUNTER — Encounter (HOSPITAL_COMMUNITY): Payer: Self-pay | Admitting: *Deleted

## 2021-05-14 DIAGNOSIS — O99824 Streptococcus B carrier state complicating childbirth: Secondary | ICD-10-CM | POA: Diagnosis present

## 2021-05-14 DIAGNOSIS — Z20822 Contact with and (suspected) exposure to covid-19: Secondary | ICD-10-CM | POA: Diagnosis present

## 2021-05-14 DIAGNOSIS — B951 Streptococcus, group B, as the cause of diseases classified elsewhere: Secondary | ICD-10-CM | POA: Diagnosis present

## 2021-05-14 DIAGNOSIS — O99820 Streptococcus B carrier state complicating pregnancy: Secondary | ICD-10-CM

## 2021-05-14 DIAGNOSIS — O09529 Supervision of elderly multigravida, unspecified trimester: Secondary | ICD-10-CM

## 2021-05-14 DIAGNOSIS — Z3A39 39 weeks gestation of pregnancy: Secondary | ICD-10-CM

## 2021-05-14 DIAGNOSIS — O26893 Other specified pregnancy related conditions, third trimester: Secondary | ICD-10-CM | POA: Diagnosis present

## 2021-05-14 DIAGNOSIS — Z87891 Personal history of nicotine dependence: Secondary | ICD-10-CM | POA: Diagnosis not present

## 2021-05-14 LAB — CBC
HCT: 33.2 % — ABNORMAL LOW (ref 36.0–46.0)
Hemoglobin: 10.9 g/dL — ABNORMAL LOW (ref 12.0–15.0)
MCH: 30.3 pg (ref 26.0–34.0)
MCHC: 32.8 g/dL (ref 30.0–36.0)
MCV: 92.2 fL (ref 80.0–100.0)
Platelets: 160 10*3/uL (ref 150–400)
RBC: 3.6 MIL/uL — ABNORMAL LOW (ref 3.87–5.11)
RDW: 14.3 % (ref 11.5–15.5)
WBC: 5.4 10*3/uL (ref 4.0–10.5)
nRBC: 0 % (ref 0.0–0.2)

## 2021-05-14 LAB — TYPE AND SCREEN
ABO/RH(D): O POS
Antibody Screen: NEGATIVE

## 2021-05-14 LAB — RPR: RPR Ser Ql: NONREACTIVE

## 2021-05-14 LAB — RESP PANEL BY RT-PCR (FLU A&B, COVID) ARPGX2
Influenza A by PCR: NEGATIVE
Influenza B by PCR: NEGATIVE
SARS Coronavirus 2 by RT PCR: NEGATIVE

## 2021-05-14 MED ORDER — LACTATED RINGERS IV SOLN: 500.0000 mL | INTRAVENOUS | Status: DC | PRN

## 2021-05-14 MED ORDER — ONDANSETRON HCL 4 MG PO TABS: 4.0000 mg | ORAL_TABLET | ORAL | Status: DC | PRN

## 2021-05-14 MED ORDER — TETANUS-DIPHTH-ACELL PERTUSSIS 5-2.5-18.5 LF-MCG/0.5 IM SUSY: 0.5000 mL | PREFILLED_SYRINGE | Freq: Once | INTRAMUSCULAR | Status: DC

## 2021-05-14 MED ORDER — SIMETHICONE 80 MG PO CHEW: 80.0000 mg | CHEWABLE_TABLET | ORAL | Status: DC | PRN

## 2021-05-14 MED ORDER — WITCH HAZEL-GLYCERIN EX PADS: 1.0000 "application " | MEDICATED_PAD | CUTANEOUS | Status: DC | PRN

## 2021-05-14 MED ORDER — FENTANYL-BUPIVACAINE-NACL 0.5-0.125-0.9 MG/250ML-% EP SOLN
12.0000 mL/h | EPIDURAL | Status: DC | PRN
  Filled 2021-05-14: qty 250

## 2021-05-14 MED ORDER — SODIUM CHLORIDE 0.9 % IV SOLN
5.0000 10*6.[IU] | Freq: Once | INTRAVENOUS | Status: AC
  Administered 2021-05-14: 5 10*6.[IU] via INTRAVENOUS
  Filled 2021-05-14: qty 5

## 2021-05-14 MED ORDER — FENTANYL CITRATE (PF) 100 MCG/2ML IJ SOLN
50.0000 ug | INTRAMUSCULAR | Status: DC | PRN
  Administered 2021-05-14: 100 ug via INTRAVENOUS
  Filled 2021-05-14: qty 2

## 2021-05-14 MED ORDER — EPHEDRINE 5 MG/ML INJ: 10.0000 mg | INTRAVENOUS | Status: DC | PRN

## 2021-05-14 MED ORDER — BENZOCAINE-MENTHOL 20-0.5 % EX AERO: 1.0000 "application " | INHALATION_SPRAY | CUTANEOUS | Status: DC | PRN

## 2021-05-14 MED ORDER — OXYTOCIN BOLUS FROM INFUSION
333.0000 mL | Freq: Once | INTRAVENOUS | Status: AC
  Administered 2021-05-14: 333 mL via INTRAVENOUS

## 2021-05-14 MED ORDER — ACETAMINOPHEN 325 MG PO TABS: 650.0000 mg | ORAL_TABLET | ORAL | Status: DC | PRN

## 2021-05-14 MED ORDER — PHENYLEPHRINE 40 MCG/ML (10ML) SYRINGE FOR IV PUSH (FOR BLOOD PRESSURE SUPPORT): 80.0000 ug | PREFILLED_SYRINGE | INTRAVENOUS | Status: DC | PRN

## 2021-05-14 MED ORDER — ACETAMINOPHEN 325 MG PO TABS
650.0000 mg | ORAL_TABLET | ORAL | Status: DC | PRN
  Administered 2021-05-14: 650 mg via ORAL
  Filled 2021-05-14: qty 2

## 2021-05-14 MED ORDER — OXYTOCIN-SODIUM CHLORIDE 30-0.9 UT/500ML-% IV SOLN
2.5000 [IU]/h | INTRAVENOUS | Status: DC
  Filled 2021-05-14: qty 500

## 2021-05-14 MED ORDER — OXYCODONE-ACETAMINOPHEN 5-325 MG PO TABS: 1.0000 | ORAL_TABLET | ORAL | Status: DC | PRN

## 2021-05-14 MED ORDER — LIDOCAINE HCL (PF) 1 % IJ SOLN: 30.0000 mL | INTRAMUSCULAR | Status: DC | PRN

## 2021-05-14 MED ORDER — DIPHENHYDRAMINE HCL 25 MG PO CAPS: 25.0000 mg | ORAL_CAPSULE | Freq: Four times a day (QID) | ORAL | Status: DC | PRN

## 2021-05-14 MED ORDER — ONDANSETRON HCL 4 MG/2ML IJ SOLN: 4.0000 mg | Freq: Four times a day (QID) | INTRAMUSCULAR | Status: DC | PRN

## 2021-05-14 MED ORDER — PRENATAL MULTIVITAMIN CH
1.0000 | ORAL_TABLET | Freq: Every day | ORAL | Status: DC
  Administered 2021-05-15 – 2021-05-16 (×2): 1 via ORAL
  Filled 2021-05-14 (×2): qty 1

## 2021-05-14 MED ORDER — SOD CITRATE-CITRIC ACID 500-334 MG/5ML PO SOLN: 30.0000 mL | ORAL | Status: DC | PRN

## 2021-05-14 MED ORDER — IBUPROFEN 600 MG PO TABS
600.0000 mg | ORAL_TABLET | Freq: Four times a day (QID) | ORAL | Status: DC
  Administered 2021-05-14 – 2021-05-16 (×8): 600 mg via ORAL
  Filled 2021-05-14 (×8): qty 1

## 2021-05-14 MED ORDER — COCONUT OIL OIL
1.0000 "application " | TOPICAL_OIL | Status: DC | PRN
  Administered 2021-05-15: 1 via TOPICAL

## 2021-05-14 MED ORDER — LACTATED RINGERS IV SOLN: 500.0000 mL | Freq: Once | INTRAVENOUS | Status: DC

## 2021-05-14 MED ORDER — OXYCODONE-ACETAMINOPHEN 5-325 MG PO TABS: 2.0000 | ORAL_TABLET | ORAL | Status: DC | PRN

## 2021-05-14 MED ORDER — ZOLPIDEM TARTRATE 5 MG PO TABS: 5.0000 mg | ORAL_TABLET | Freq: Every evening | ORAL | Status: DC | PRN

## 2021-05-14 MED ORDER — DIPHENHYDRAMINE HCL 50 MG/ML IJ SOLN: 12.5000 mg | INTRAMUSCULAR | Status: DC | PRN

## 2021-05-14 MED ORDER — ONDANSETRON HCL 4 MG/2ML IJ SOLN: 4.0000 mg | INTRAMUSCULAR | Status: DC | PRN

## 2021-05-14 MED ORDER — LACTATED RINGERS IV SOLN: INTRAVENOUS | Status: DC

## 2021-05-14 MED ORDER — SENNOSIDES-DOCUSATE SODIUM 8.6-50 MG PO TABS
2.0000 | ORAL_TABLET | Freq: Every day | ORAL | Status: DC
  Administered 2021-05-15: 2 via ORAL
  Filled 2021-05-14: qty 2

## 2021-05-14 MED ORDER — DIBUCAINE (PERIANAL) 1 % EX OINT: 1.0000 "application " | TOPICAL_OINTMENT | CUTANEOUS | Status: DC | PRN

## 2021-05-14 MED ORDER — PENICILLIN G POT IN DEXTROSE 60000 UNIT/ML IV SOLN
3.0000 10*6.[IU] | INTRAVENOUS | Status: DC
  Administered 2021-05-14: 3 10*6.[IU] via INTRAVENOUS
  Filled 2021-05-14: qty 50

## 2021-05-14 NOTE — [facility] Note (Signed)
Presents stating her water broke this morning @ 0620, clear fluid.  Reports having ctxs 5 minutes apart.  Denies VB, had spotting.  Endorses +FM.

## 2021-05-14 NOTE — H&P (Addendum)
OBSTETRIC ADMISSION HISTORY AND PHYSICAL  Brooke [name] is a 35 y.o. female [phone] with IUP at [redacted]w[redacted]d by LMP presenting for leakage of clear fluid at 0620 and contractions 5 minutes apart. Reports vaginal spotting. She reports +FMs, no blurry vision, headaches or peripheral edema, and RUQ pain.  She plans on breast and bottle feeding. She request OCP's for birth control. She received her prenatal care at [facility]   Dating: By LMP --->  Estimated Date of Delivery: 05/20/21  Sono:    @[redacted]w[redacted]d , CWD, normal anatomy, posterior placenta. Weight unknown.   Prenatal History/Complications: AMA. GBS+, Hx spontaneous abortion  Past Medical History: History reviewed. No pertinent past medical history.  Past Surgical History: History reviewed. No pertinent surgical history.  Obstetrical History: OB History     Gravida  5   Para  2   Term  2   Preterm      AB  2   Living  2      SAB  2   IAB      Ectopic      Multiple      Live Births  1           Social History Social History   Socioeconomic History   Marital status: Single    Spouse name: Not on file   Number of children: Not on file   Years of education: Not on file   Highest education level: Not on file  Occupational History   Not on file  Tobacco Use   Smoking status: Former    Types: Cigarettes   Smokeless tobacco: Never  Vaping Use   Vaping Use: Never used  Substance and Sexual Activity   Alcohol use: No   Drug use: No   Sexual activity: Yes    Birth control/protection: None  Other Topics Concern   Not on file  Social History Narrative   Not on file   Social Determinants of Health   Financial Resource Strain: Not on file  Food Insecurity: Not on file  Transportation Needs: Not on file  Physical Activity: Not on file  Stress: Not on file  Social Connections: Not on file    Family History: History reviewed. No pertinent family history.  Allergies: No Known Allergies  Medications  Prior to Admission  Medication Sig Dispense Refill Last Dose   acetaminophen (TYLENOL) 500 MG tablet Take 500 mg by mouth every 6 (six) hours as needed for moderate pain or headache.   Past Month   Prenatal Vit-Fe Fumarate-FA (PRENATAL MULTIVITAMIN) TABS tablet Take 1 tablet by mouth daily at 12 noon.   05/13/2021     Review of Systems   All systems reviewed and negative except as stated in HPI  Blood pressure 126/67, pulse 69, temperature 98.8 F (37.1 C), temperature source Oral, resp. rate 16, height 5\' 2"  (1.575 m), weight 72.9 kg, SpO2 100 %, unknown if currently breastfeeding. General appearance: alert, cooperative, and intermittently with painful contractions Lungs: normal work of breathing Heart: regular rate and rhythm Abdomen: gravid Pelvic: see below Extremities: No swelling, no sign of DVT Fetal monitoringBaseline: 130 bpm, Variability: Good {> 6 bpm), Accelerations: none, and Decelerations: Absent Uterine activityFrequency: Every 2-3 minutes and Intensity: strong Dilation: 5 Effacement (%): 100 Station: -2 Exam by:: k fields, rn   Prenatal labs: ABO, Rh: --/--/O POS (09/28 0805) Antibody: NEG (09/28 0805) Rubella: Immune (04/21 0000) RPR: Nonreactive (04/21 0000)  HBsAg: Negative (04/21 0000)  HIV: Non-reactive (04/21 0000)  GBS:  Positive/-- (09/08 0000)  1 hr Glucola passed Genetic screening  declined Anatomy US normal  Prenatal Transfer Tool  Maternal Diabetes: No Genetic Screening: Declined Maternal Ultrasounds/Referrals: Normal Fetal Ultrasounds or other Referrals:  None Maternal Substance Abuse:  No Significant Maternal Medications:  None Significant Maternal Lab Results: Group B Strep positive  Results for orders placed or performed during the hospital encounter of 05/14/21 (from the past 24 hour(s))  Type and screen [facility]   Collection Time: 05/14/21  8:05 AM  Result Value Ref Range   ABO/RH(D) O POS    Antibody Screen NEG     Sample Expiration      05/17/2021,2359 Performed at [facility], [address], [place], [place] [address]   Resp Panel by RT-PCR (Flu A&B, Covid) Nasopharyngeal Swab   Collection Time: 05/14/21  8:12 AM   Specimen: Nasopharyngeal Swab; Nasopharyngeal(NP) swabs in vial transport medium  Result Value Ref Range   SARS Coronavirus 2 by RT PCR NEGATIVE NEGATIVE   Influenza A by PCR NEGATIVE NEGATIVE   Influenza B by PCR NEGATIVE NEGATIVE  CBC   Collection Time: 05/14/21  8:12 AM  Result Value Ref Range   WBC 5.4 4.0 - 10.5 K/uL   RBC 3.60 (L) 3.87 - 5.11 MIL/uL   Hemoglobin 10.9 (L) 12.0 - 15.0 g/dL   HCT 02.1 (L) 11.5 - 52.0 %   MCV 92.2 80.0 - 100.0 fL   MCH 30.3 26.0 - 34.0 pg   MCHC 32.8 30.0 - 36.0 g/dL   RDW 80.2 23.3 - 61.2 %   Platelets 160 150 - 400 K/uL   nRBC 0.0 0.0 - 0.2 %    Patient Active Problem List   Diagnosis Date Noted   Indication for care in labor and delivery, antepartum 05/14/2021   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 [id number] at [redacted]w[redacted]d here for SROM  #Labor:Expectant management #Pain: Desires epidural. Ordering now #FWB: Cat 1 #ID:  GBS+. Penicillin administered #MOF: Breast and bottle #MOC:POPs #Circ:  Declined  Mabeline Caras, PGY-1, Faculty Service 05/14/2021, 11:09 AM    Attestation of Supervision of Student:  I confirm that I have verified the information documented in the  resident 's note and that I have also personally reperformed the history, physical exam and all medical decision making activities.  I have verified that all services and findings are accurately documented in this student's note; and I agree with management and plan as outlined in the documentation. I have also made any necessary editorial changes.   Raelyn Mora, CNM [facility] [employer], [facility] 05/14/2021 11:39  AM

## 2021-05-14 NOTE — Lactation Note (Signed)
This note was copied from a baby's chart. Lactation Consultation Note  Patient Name: Brooke [name] Date: 05/14/2021 Reason for consult: L&D Initial assessment;Term Age:36 hours   Initial L&D Consult:  Visited with family < 1 hour after birth Baby congested and fussy.  Attempted to latch, however, baby continued to be fussy.  Burped him and rubbed his back.  Latched easily but he was not interested in initiating a suck.  Placed him back STS on mother's chest.  Reassured parents that lactation services will be available on the M/B unit.  Father present.  Allowed time for family bonding.     Maternal Data    Feeding Mother's Current Feeding Choice: Breast Milk  LATCH Score Latch: Too sleepy or reluctant, no latch achieved, no sucking elicited.  Audible Swallowing: None  Type of Nipple: Everted at rest and after stimulation  Comfort (Breast/Nipple): Soft / non-tender  Hold (Positioning): Assistance needed to correctly position infant at breast and maintain latch.  LATCH Score: 5   Lactation Tools Discussed/Used    Interventions Interventions: Assisted with latch;Skin to skin  Discharge    Consult Status Consult Status: Follow-up from L&D Date: 05/14/21 Follow-up type: In-patient    Cope Marte R Aziel Morgan 05/14/2021, 1:52 PM

## 2021-05-14 NOTE — Discharge Summary (Deleted)
Postpartum Discharge Summary      Patient Name: Brooke [name] DOB: September 22, 1985 MRN: [id number]  Date of admission: 05/14/2021 Delivery date:05/14/2021  Delivering provider: Lattie Corns  Date of discharge: 05/15/2021  Admitting diagnosis: Indication for care in labor and delivery, antepartum [O75.9] Intrauterine pregnancy: [redacted]w[redacted]d    Secondary diagnosis:  Active Problems:   Indication for care in labor and delivery, antepartum   Positive testing for group B Streptococcus   AMA (advanced maternal age) multigravida 35+  Additional problems: none    Discharge diagnosis: Term Pregnancy Delivered                                              Post partum procedures: none Augmentation: N/A Complications: None  Hospital course: Onset of Labor With Vaginal Delivery      35y.o. yo [id number] 324w1das admitted in Active Labor on 05/14/2021. Patient had an uncomplicated labor course as follows:  Membrane Rupture Time/Date: 6:20 AM ,05/14/2021   Delivery Method:Vaginal, Spontaneous  Episiotomy: None  Lacerations:  None  Patient had an uncomplicated postpartum course.  She is ambulating, tolerating a regular diet, passing flatus, and urinating well. Patient is discharged home in stable condition on 05/15/21 per her request for early d/c.  Newborn Data: Birth date:05/14/2021  Birth time:1:02 PM  Gender:Female  Living status:Living  Apgars:7 ,9  Weight:4386 g (9lb 10.7oz)  Magnesium Sulfate received: No BMZ received: No Rhophylac:N/A MMR:No T-DaP:Given prenatally Flu: No Transfusion:No  Physical exam  Vitals:   05/14/21 1611 05/14/21 2018 05/15/21 0027 05/15/21 0418  BP: 112/69 (!) 101/59 122/84 107/65  Pulse: 71 72 (!) 56 (!) 57  Resp: 14 17 16 17   Temp: 98.8 F (37.1 C) 99.2 F (37.3 C) 99.2 F (37.3 C) 98.2 F (36.8 C)  TempSrc: Oral Oral Oral Oral  SpO2: 99% 98% 100% 100%  Weight:      Height:       General: alert, cooperative, and no distress Lochia:  appropriate Uterine Fundus: firm Incision: N/A DVT Evaluation: No evidence of DVT seen on physical exam. No significant calf/ankle edema. Labs: Lab Results  Component Value Date   WBC 5.4 05/14/2021   HGB 10.9 (L) 05/14/2021   HCT 33.2 (L) 05/14/2021   MCV 92.2 05/14/2021   PLT 160 05/14/2021   CMP Latest Ref Rng & Units 11/02/2019  Glucose 70 - 99 mg/dL 84  BUN 6 - 20 mg/dL 8  Creatinine 0.44 - 1.00 mg/dL 0.59  Sodium 135 - 145 mmol/L 136  Potassium 3.5 - 5.1 mmol/L 3.5  Chloride 98 - 111 mmol/L 104  CO2 22 - 32 mmol/L 23  Calcium 8.9 - 10.3 mg/dL 9.3   Edinburgh Score: Edinburgh Postnatal Depression Scale Screening Tool 05/15/2021  I have been able to laugh and see the funny side of things. 0  I have looked forward with enjoyment to things. 0  I have blamed myself unnecessarily when things went wrong. 0  I have been anxious or worried for no good reason. 1  I have felt scared or panicky for no good reason. 0  Things have been getting on top of me. 3  I have been so unhappy that I have had difficulty sleeping. 0  I have felt sad or miserable. 0  I have been so unhappy that I have been crying. 1  The thought of harming myself has occurred to me. 0  Edinburgh Postnatal Depression Scale Total 5     After visit meds:  Allergies as of 05/15/2021   No Known Allergies      Medication List     TAKE these medications    acetaminophen 500 MG tablet Commonly known as: TYLENOL Take 500 mg by mouth every 6 (six) hours as needed for moderate pain or headache.   ibuprofen 200 MG tablet Commonly known as: ADVIL Take 3 tablets (600 mg total) by mouth every 6 (six) hours as needed for up to 8 days for moderate pain or mild pain.   norethindrone 0.35 MG tablet Commonly known as: Ortho Micronor Take 1 tablet (0.35 mg total) by mouth daily. Start taking on: June 08, 2021   prenatal multivitamin Tabs tablet Take 1 tablet by mouth daily at 12 noon.         Discharge  home in stable condition Infant Feeding: Bottle and Breast Infant Disposition:home with mother Discharge instruction: per After Visit Summary and Postpartum booklet. Activity: Advance as tolerated. Pelvic rest for 6 weeks.  Diet: routine diet Future Appointments:No future appointments. Follow up Visit:  Follow-up Information     Department, [facility]. Schedule an appointment as soon as possible for a visit in 4 week(s).   Why: For your postpartum appointment. Contact information: [place] [address] [phone]                  Please schedule this patient for a In person postpartum visit in 4 weeks with the following provider: Any provider. Additional Postpartum F/U: none   Low risk pregnancy complicated by:  none Delivery mode:  Vaginal, Spontaneous  Anticipated Birth Control:  POPs   05/15/2021 Mayer Masker, PGY-1, Faculty Service 12:18 PM

## 2021-05-14 NOTE — Lactation Note (Signed)
This note was copied from a baby's chart. Lactation Consultation Note  Patient Name: Brooke [name] Date: 05/14/2021 Reason for consult: Initial assessment Age:36 hours  Consult was done in Spanish: Initial consult with 5 hours old infant. Infant is skin to skin upon arrival. Mother states "Brooke [name]" pops on and off breast. Several attempts to latch infant unsuccessful. LC demonstrated hand expression, plenty of colostrum.  "Brooke [name]" started crying, noted tongue anchored to floor of mouth.  LC set up DEBP and talked about frequency/care of parts/milk storage. collected ~15 mL of EBM when demonstrating manual pump. Mother bottlefed colostrum. Placed "Brooke [name]" back skin to skin. Encouraged mother to contact LC to help with sucking exercises if tongue is released.   Feeding plan:  1-Feed on demand or 8-12 times in 24h period. 2-Pump or hand-express and offer EBM every 3h 3-Encouraged maternal rest, hydration and food intake.   Contact LC as needed for feeds/support/concerns/questions. All question answered. Promoted [facility] booklet and provided [facility] services brochure.    Maternal Data Has patient been taught Hand Expression?: Yes  Feeding Mother's Current Feeding Choice: Breast Milk and Formula Nipple Type: Slow - flow  LATCH Score Latch: Repeated attempts needed to sustain latch, nipple held in mouth throughout feeding, stimulation needed to elicit sucking reflex.  Audible Swallowing: None  Type of Nipple: Everted at rest and after stimulation  Comfort (Breast/Nipple): Soft / non-tender  Hold (Positioning): No assistance needed to correctly position infant at breast.  LATCH Score: 7   Lactation Tools Discussed/Used Tools: Pump;Flanges Flange Size: 21 Breast pump type: Double-Electric Breast Pump;Manual Pump Education: Setup, frequency, and cleaning;Milk Storage Reason for Pumping: stimulation and supplementation Pumping frequency: Q3 Pumped volume: 15 mL  (demonstrating hand pump)  Interventions Interventions: Breast feeding basics reviewed;Skin to skin;Breast massage;Hand express;DEBP;Hand pump;Expressed milk;Education  Discharge Pump: DEBP;Manual WIC Program: Yes  Consult Status Consult Status: Follow-up Date: 05/15/21 Follow-up type: In-patient    Philmore Lepore A Higuera Ancidey 05/14/2021, 6:58 PM

## 2021-05-15 DIAGNOSIS — O09529 Supervision of elderly multigravida, unspecified trimester: Secondary | ICD-10-CM

## 2021-05-15 DIAGNOSIS — B951 Streptococcus, group B, as the cause of diseases classified elsewhere: Secondary | ICD-10-CM | POA: Diagnosis present

## 2021-05-15 MED ORDER — IBUPROFEN 200 MG PO TABS: 600.0000 mg | ORAL_TABLET | Freq: Four times a day (QID) | ORAL | 0 refills | Status: AC | PRN

## 2021-05-15 MED ORDER — COVID-19 MRNA VAC-TRIS(PFIZER) 30 MCG/0.3ML IM SUSP
0.3000 mL | Freq: Once | INTRAMUSCULAR | Status: AC
  Administered 2021-05-16: 0.3 mL via INTRAMUSCULAR
  Filled 2021-05-15: qty 0.3

## 2021-05-15 MED ORDER — NORETHINDRONE 0.35 MG PO TABS: 1.0000 | ORAL_TABLET | Freq: Every day | ORAL | 11 refills | Status: DC

## 2021-05-15 MED ORDER — COVID-19MRNA BIVAL VAC MODERNA 50 MCG/0.5ML IM SUSP
0.5000 mL | Freq: Once | INTRAMUSCULAR | Status: DC
Start: 2021-05-16 — End: 2021-05-15

## 2021-05-15 NOTE — Lactation Note (Signed)
This note was copied from a baby's chart. Lactation Consultation Note  Patient Name: Brooke [name] Date: 05/15/2021 Reason for consult: Follow-up assessment;Term;1st time breastfeeding;Infant weight loss;Other (Comment) (tongue mobility issues) Age:36 hours LC was asked to see baby and assess whether he could be D/C and was told about the tongue mobility issue. It was reported to this LC it was not interfering with latching. MBURN called LC - baby was latching.  As LC entered the room mom was attempting to latch with difficulty in the cradle position on the right breast . LC offered to assist and LC showed mom the cross cradle position and worked on depth. ( See doc flow sheets for details ).  Latch score - 7 right and Latch score 9 left breast with increased swallows and improved depth.  LC Plan - recommended to hold D/C so mom could work on pumping to bring milk in .  Prior to latch - hand expressing , and pre - pump with hand pump  Offer both breast every feeding  Post pump and feed the baby back EBM.   LC's assessment of the oral cavity - upper lip stretches with exam and latch, ( noted the skin notch to be close to the gum line.  Indentation of the midline section of the tongue, and when the baby cries both sides of the tongue circle up ,  The midline skin notch anterior portion of the tongue is borderline tight to short. Baby doesn't extend the tongue over the gum line or raise above the corners of the mouth.  LC reported to the [facility] and Dr. Mosie Lukes LC's findings and above LC plan .   DEBP had already been set up. Per mom mentioned the #21 flange was comfortable.    Maternal Data Has patient been taught Hand Expression?: Yes Does the patient have breastfeeding experience prior to this delivery?: No  Feeding Mother's Current Feeding Choice: Breast Milk and Formula  LATCH Score Latch: Grasps breast easily, tongue down, lips flanged, rhythmical  sucking.  Audible Swallowing: Spontaneous and intermittent  Type of Nipple: Everted at rest and after stimulation  Comfort (Breast/Nipple): Soft / non-tender  Hold (Positioning): Assistance needed to correctly position infant at breast and maintain latch.  LATCH Score: 9   Lactation Tools Discussed/Used    Interventions Interventions: Breast feeding basics reviewed;Assisted with latch;Skin to skin;Breast compression;Adjust position;Support pillows;Position options;Hand pump;DEBP;Education  Discharge    Consult Status Consult Status: Follow-up Date: 05/16/21 Follow-up type: In-patient    Brooke [name] 05/15/2021, 12:31 PM

## 2021-05-15 NOTE — Progress Notes (Signed)
Pt's baby staying as a patient overnight. Pt requests to stay at a patient.

## 2021-05-15 NOTE — Progress Notes (Signed)
POSTPARTUM PROGRESS NOTE  Post Partum Day 1  Subjective:  Brooke [name] is a 35 y.o. [id number] s/p SVD at [redacted]w[redacted]d.  She reports she is doing well. No acute events overnight. She denies any problems with ambulating, voiding or po intake. Denies nausea or vomiting.  Pain is well controlled.  Lochia is mild and improving.  Objective: Blood pressure 107/65, pulse (!) 57, temperature 98.2 F (36.8 C), temperature source Oral, resp. rate 17, height 5\' 2"  (1.575 m), weight 72.9 kg, SpO2 100 %, unknown if currently breastfeeding.  Physical Exam:  General: alert, cooperative and no distress Chest: no respiratory distress Heart:regular rate, distal pulses intact Abdomen: soft, nontender,  Uterine Fundus: firm, appropriately tender DVT Evaluation: No calf swelling or tenderness Extremities: no edema Skin: warm, dry  Recent Labs    05/14/21 0812  HGB 10.9*  HCT 33.2*    Assessment/Plan: Brooke [name] is a 35 y.o. 31 s/p SVD at [redacted]w[redacted]d   PPD#1 - Doing well  Routine postpartum care  Contraception: POPs Feeding: breast and bottle Dispo: Plan for discharge home later today.   LOS: 1 day   [redacted]w[redacted]d, PGY-1, Faculty Service 05/15/2021, 7:32 AM

## 2021-05-16 ENCOUNTER — Ambulatory Visit: Payer: Self-pay

## 2021-05-16 NOTE — Discharge Summary (Addendum)
Postpartum Discharge Summary      Patient Name: Brooke [name] DOB: 1985-10-05 MRN: [id number]  Date of admission: 05/14/2021 Delivery date:05/14/2021  Delivering provider: Lattie Corns  Date of discharge: 05/16/2021  Admitting diagnosis: Indication for care in labor and delivery, antepartum [O75.9] Intrauterine pregnancy: [redacted]w[redacted]d    Secondary diagnosis:  Active Problems:   Indication for care in labor and delivery, antepartum   Positive testing for group B Streptococcus   AMA (advanced maternal age) multigravida 35+  Additional problems: none    Discharge diagnosis: Term Pregnancy Delivered                                              Post partum procedures: none Augmentation: N/A Complications: None  Hospital course: Onset of Labor With Vaginal Delivery      35y.o. yo [id number] 332w1das admitted in Active Labor on 05/14/2021. Patient had an uncomplicated labor course as follows:  Membrane Rupture Time/Date: 6:20 AM ,05/14/2021   Delivery Method:Vaginal, Spontaneous  Episiotomy: None  Lacerations:  None  Patient had an uncomplicated postpartum course.  She is ambulating, tolerating a regular diet, passing flatus, and urinating well. Patient is discharged home in stable condition on 05/16/21 per her request for early d/c.  Newborn Data: Birth date:05/14/2021  Birth time:1:02 PM  Gender:Female  Living status:Living  Apgars:7 ,9  Weight:4386 g (9lb 10.7oz)  Magnesium Sulfate received: No BMZ received: No Rhophylac:N/A MMR:No T-DaP:Given prenatally Flu: No Transfusion:No  Physical exam  Vitals:   05/15/21 0418 05/15/21 1535 05/15/21 2200 05/16/21 0527  BP: 107/65 108/72 111/78 107/65  Pulse: (!) 57 68 67 66  Resp: _0 Temp: 98.2 F (36.8 C) 98.4 F (36.9 C) 99.1 F (37.3 C) 97.9 F (36.6 C)  TempSrc: Oral Oral Oral Oral  SpO2: 100% 97% 99% 99%  Weight:      Height:       General: alert, cooperative, and no distress Lochia:  appropriate Uterine Fundus: firm Incision: N/A DVT Evaluation: No evidence of DVT seen on physical exam. No significant calf/ankle edema. Labs: Lab Results  Component Value Date   WBC 5.4 05/14/2021   HGB 10.9 (L) 05/14/2021   HCT 33.2 (L) 05/14/2021   MCV 92.2 05/14/2021   PLT 160 05/14/2021   CMP Latest Ref Rng & Units 11/02/2019  Glucose 70 - 99 mg/dL 84  BUN 6 - 20 mg/dL 8  Creatinine 0.44 - 1.00 mg/dL 0.59  Sodium 135 - 145 mmol/L 136  Potassium 3.5 - 5.1 mmol/L 3.5  Chloride 98 - 111 mmol/L 104  CO2 22 - 32 mmol/L 23  Calcium 8.9 - 10.3 mg/dL 9.3   Edinburgh Score: Edinburgh Postnatal Depression Scale Screening Tool 05/15/2021  I have been able to laugh and see the funny side of things. 0  I have looked forward with enjoyment to things. 0  I have blamed myself unnecessarily when things went wrong. 0  I have been anxious or worried for no good reason. 1  I have felt scared or panicky for no good reason. 0  Things have been getting on top of me. 3  I have been so unhappy that I have had difficulty sleeping. 0  I have felt sad or miserable. 0  I have been so unhappy that I have been crying. 1  The thought  of harming myself has occurred to me. 0  Edinburgh Postnatal Depression Scale Total 5     After visit meds:  Allergies as of 05/16/2021   No Known Allergies      Medication List     TAKE these medications    acetaminophen 500 MG tablet Commonly known as: TYLENOL Take 500 mg by mouth every 6 (six) hours as needed for moderate pain or headache.   ibuprofen 200 MG tablet Commonly known as: ADVIL Take 3 tablets (600 mg total) by mouth every 6 (six) hours as needed for up to 8 days for moderate pain or mild pain.   norethindrone 0.35 MG tablet Commonly known as: Ortho Micronor Take 1 tablet (0.35 mg total) by mouth daily. Start taking on: June 08, 2021   prenatal multivitamin Tabs tablet Take 1 tablet by mouth daily at 12 noon.        Discharge  home in stable condition Infant Feeding: Bottle and Breast Infant Disposition:home with mother Discharge instruction: per After Visit Summary and Postpartum booklet. Activity: Advance as tolerated. Pelvic rest for 6 weeks.  Diet: routine diet Future Appointments:No future appointments. Follow up Visit:  Follow-up Information     Department, [facility]. Schedule an appointment as soon as possible for a visit in 4 week(s).   Why: For your postpartum appointment. Contact information: [place] [address] [phone]                  Please schedule this patient for a In person postpartum visit in 4 weeks with the following provider: Any provider. Additional Postpartum F/U: none   Low risk pregnancy complicated by:  none Delivery mode:  Vaginal, Spontaneous  Anticipated Birth Control:  POPs   05/16/2021 Gifford Shave, MD, PGY-3, Faculty Service 8:03 AM  GME ATTESTATION:  I saw and evaluated the patient. I agree with the findings and the plan of care as documented in the resident's note.  Darrelyn Hillock, DO OB Fellow, [place] for [place] 05/16/2021 8:39 AM

## 2021-05-16 NOTE — Lactation Note (Signed)
This note was copied from a baby's chart. Lactation Consultation Note  Patient Name: Brooke [name] HFGBM'S Date: 05/16/2021   Age:36 hours  LC talked with Mom to see if she would like to continue lactation since mostly bottle feeding formula. Mom stated only formula feed and will no longer require lactation services.   Maternal Data    Feeding Nipple Type: Dr. Lorne Skeens  [facility] Score                    Lactation Tools Discussed/Used    Interventions    Discharge    Consult Status      Brooke Mcclintic  [name] 05/16/2021, 10:05 PM

## 2021-05-24 ENCOUNTER — Telehealth (HOSPITAL_COMMUNITY): Payer: Self-pay

## 2021-05-24 NOTE — Telephone Encounter (Signed)
No answer. Left message to return nurse call.  Marcelino Duster [facility] 05/24/2021,1602

## 2022-09-08 ENCOUNTER — Other Ambulatory Visit (HOSPITAL_COMMUNITY)
Admission: RE | Admit: 2022-09-08 | Discharge: 2022-09-08 | Disposition: A | Discharge status: Home / Self Care | Payer: Self-pay | Source: Ambulatory Visit | Attending: Obstetrics and Gynecology | Admitting: Obstetrics and Gynecology

## 2022-09-08 ENCOUNTER — Ambulatory Visit: Payer: Self-pay | Admitting: Hematology and Oncology

## 2022-09-08 VITALS — BP 128/78 | Ht 62.0 in | Wt 135.0 lb

## 2022-09-08 DIAGNOSIS — B977 Papillomavirus as the cause of diseases classified elsewhere: Secondary | ICD-10-CM | POA: Insufficient documentation

## 2022-09-08 DIAGNOSIS — Z01812 Encounter for preprocedural laboratory examination: Secondary | ICD-10-CM

## 2022-09-08 LAB — PREGNANCY, URINE

## 2022-09-08 NOTE — Progress Notes (Signed)
Ms. Chan Rosasco is a 37 y.o. female who presents to [facility] clinic today with no complaints.    Pap Smear: Pap not smear completed today. Last Pap smear was 06/03/22 and was  Normal with HPV+ . Per patient has history of an abnormal Pap smear. Last Pap smear result is available in Epic. 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 No skin abnormalities bilateral breasts. No nipple retraction bilateral breasts. No nipple discharge bilateral breasts. No lymphadenopathy. No lumps palpated bilateral breasts.       Pelvic/Bimanual Pap is not indicated today    Smoking History: Patient has never smoked and was not referred to quit line.    Patient Navigation: Patient education provided. Access to services provided for patient through [place] interpreter provided. No transportation provided   Colorectal Cancer Screening: Per patient has never had colonoscopy completed No complaints today.    Breast and Cervical Cancer Risk Assessment: Patient does not have family history of breast cancer, known genetic mutations, or radiation treatment to the chest before age 60. Patient has history of cervical dysplasia, immunocompromised, or DES exposure in-utero.  Risk Scores as of 09/08/2022     [place] [facility]           5-year 0.36 %   Lifetime 9.15 %   This patient is [place]/Latina but has no documented birth country, so the [facility] used data from [place] patients to calculate their risk score. Document a birth country in the Demographics activity for a more accurate score.         Last calculated by Royston Bake, CMA on 09/08/2022 at  9:54 AM        A: BCCCP exam without pap smear No complaints with benign breast exam.   P: Yearly clinical breast exam.  Melodye Ped, NP 09/08/2022 9:57 AM

## 2022-09-08 NOTE — Progress Notes (Addendum)
[place] COLPOSCOPY PROCEDURE NOTE  Ms. Gloria Ricardo is a 37 y.o. [id number] here for colposcopy for  Normal/ HPV+  pap smear on 05/2022. HGSIL/ HPV+ in 2017. ASCUS/ HPV+ 2022. Discussed role for HPV in cervical dysplasia, need for surveillance.  Patient given informed consent, signed copy in the chart, time out was performed.  Placed in lithotomy position. Cervix viewed with speculum and colposcope after application of acetic acid.   Colposcopy adequate? Yes  no visible lesions.  ECC specimen obtained. All specimens were labelled and sent to pathology.  Patient was given post procedure instructions.  Will follow up pathology and manage accordingly.  Routine preventative health maintenance measures emphasized.   Melodye Ped, NP 09/08/2022 10:23 AM

## 2022-09-10 LAB — SURGICAL PATHOLOGY

## 2022-09-14 ENCOUNTER — Telehealth: Payer: Self-pay

## 2022-09-14 NOTE — Telephone Encounter (Signed)
Called patient via [name] to give colpo results. Informed patient that colpo bx showed low-grade dysplasia. Based on this result the recommendation is for repeat pap smear in 1 year (January 2025). Patient voiced understanding.
